# Patient Record
Sex: Female | Born: 1990 | Race: White | Hispanic: No | Marital: Married | Smoking: Current every day smoker
Health system: Southern US, Community
[De-identification: ages and names within clinical notes are randomized; demographics above are authoritative.]

## PROBLEM LIST (undated history)

## (undated) DIAGNOSIS — G43909 Migraine, unspecified, not intractable, without status migrainosus: Secondary | ICD-10-CM

## (undated) DIAGNOSIS — N83209 Unspecified ovarian cyst, unspecified side: Secondary | ICD-10-CM

## (undated) HISTORY — PX: TUBAL LIGATION: SHX77

---
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
## 2008-01-02 ENCOUNTER — Observation Stay: Payer: Self-pay | Admitting: Obstetrics & Gynecology

## 2008-01-08 ENCOUNTER — Observation Stay: Payer: Self-pay | Admitting: Unknown Physician Specialty

## 2008-01-09 ENCOUNTER — Observation Stay: Payer: Self-pay

## 2008-01-09 ENCOUNTER — Observation Stay: Payer: Self-pay | Admitting: Unknown Physician Specialty

## 2008-01-14 ENCOUNTER — Inpatient Hospital Stay: Payer: Self-pay

## 2008-06-26 ENCOUNTER — Emergency Department: Payer: Self-pay | Admitting: Emergency Medicine

## 2008-07-12 ENCOUNTER — Emergency Department: Payer: Self-pay | Admitting: Internal Medicine

## 2008-09-22 IMAGING — CR DG FOOT 2V*L*
1 series · 2 of 2 positions shown · non-contrast
Comparison: none

REASON FOR EXAM: pain left 5th metarsal and toe
COMMENTS:

PROCEDURE:     DXR - DXR FOOT LEFT AP AND LATERAL  - June 06, 2007  [DATE]
RESULT:     Images of the LEFT foot are compared to the exam dated
09/23/2006. There is no evidence of fracture, dislocation or radiopaque
foreign body.

[Series 1: view not recorded · 0.17mm/px · 2 of 2 slices shown]
[im 1/2]
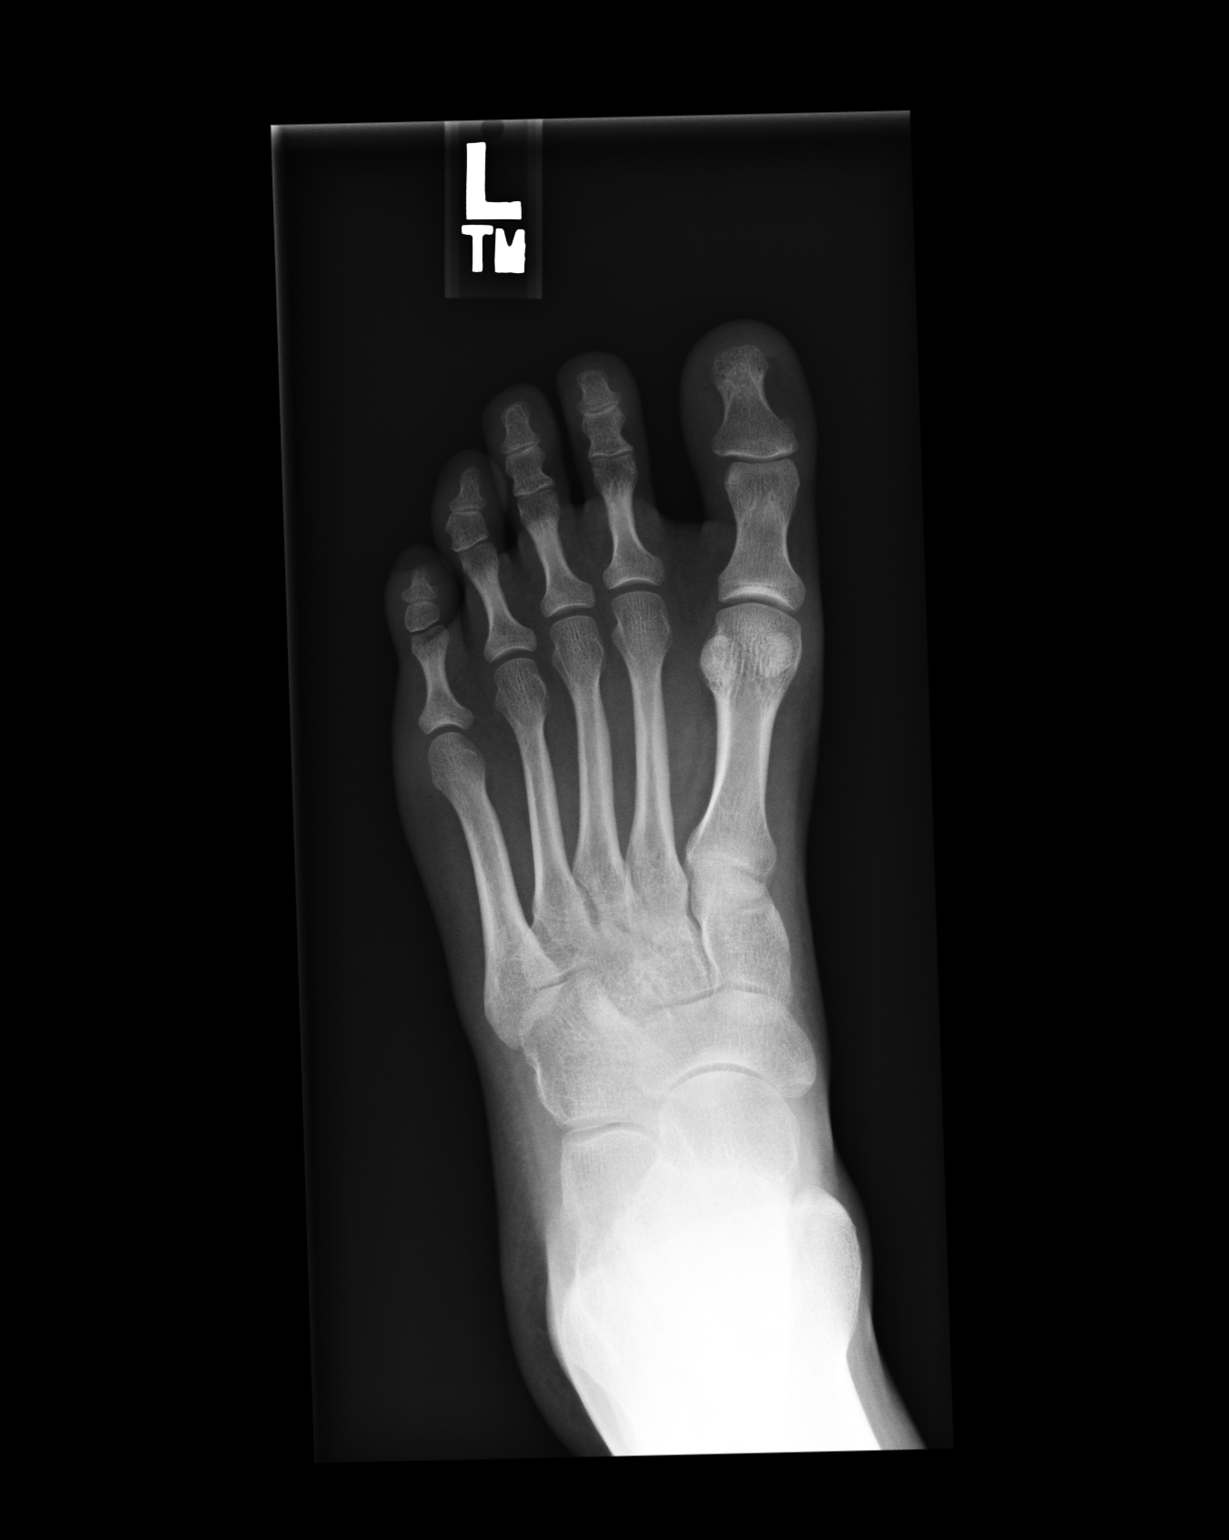
[im 2/2]
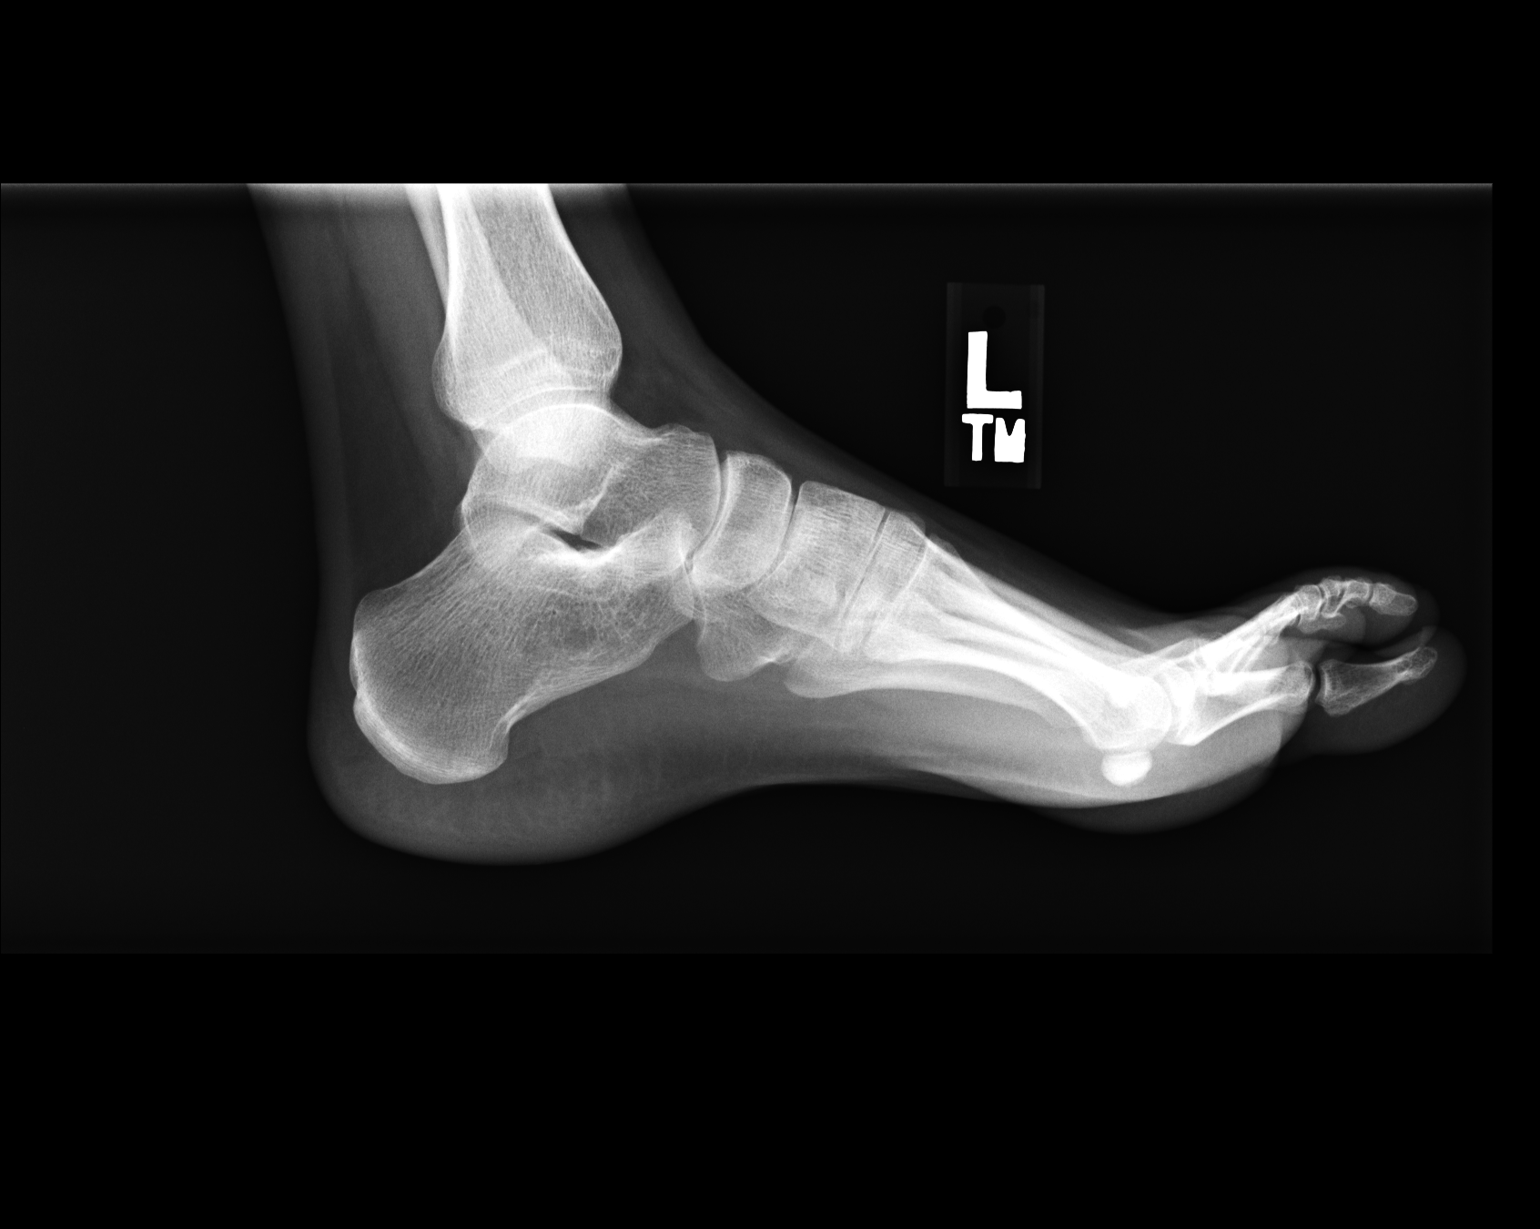

[2 of 2 positions shown; findings below may reference images not displayed]

IMPRESSION: No acute bony abnormality.

## 2008-11-02 ENCOUNTER — Emergency Department: Payer: Self-pay | Admitting: Emergency Medicine

## 2008-11-24 ENCOUNTER — Emergency Department: Payer: Self-pay

## 2009-01-08 ENCOUNTER — Emergency Department: Payer: Self-pay | Admitting: Emergency Medicine

## 2009-02-09 ENCOUNTER — Emergency Department: Payer: Self-pay

## 2009-02-17 ENCOUNTER — Ambulatory Visit: Payer: Self-pay | Admitting: Family Medicine

## 2009-04-25 ENCOUNTER — Emergency Department: Payer: Self-pay | Admitting: Emergency Medicine

## 2009-04-28 ENCOUNTER — Emergency Department: Payer: Self-pay | Admitting: Internal Medicine

## 2009-05-07 ENCOUNTER — Emergency Department: Payer: Self-pay | Admitting: Emergency Medicine

## 2009-08-30 ENCOUNTER — Emergency Department: Payer: Self-pay | Admitting: Emergency Medicine

## 2009-10-25 ENCOUNTER — Emergency Department: Payer: Self-pay | Admitting: Emergency Medicine

## 2009-11-08 ENCOUNTER — Emergency Department: Payer: Self-pay | Admitting: Emergency Medicine

## 2009-12-21 ENCOUNTER — Emergency Department: Payer: Self-pay | Admitting: Emergency Medicine

## 2009-12-31 ENCOUNTER — Inpatient Hospital Stay: Payer: Self-pay | Admitting: Psychiatry

## 2010-03-15 ENCOUNTER — Encounter: Payer: Self-pay | Admitting: Obstetrics & Gynecology

## 2010-04-01 ENCOUNTER — Observation Stay: Payer: Self-pay

## 2010-05-24 ENCOUNTER — Encounter: Payer: Self-pay | Admitting: Obstetrics & Gynecology

## 2010-06-21 ENCOUNTER — Observation Stay: Payer: Self-pay

## 2010-06-22 ENCOUNTER — Observation Stay: Payer: Self-pay

## 2010-07-04 ENCOUNTER — Observation Stay: Payer: Self-pay | Admitting: Unknown Physician Specialty

## 2010-07-15 ENCOUNTER — Observation Stay: Payer: Self-pay | Admitting: Emergency Medicine

## 2010-07-23 ENCOUNTER — Inpatient Hospital Stay: Payer: Self-pay | Admitting: Obstetrics and Gynecology

## 2010-07-27 LAB — PATHOLOGY REPORT
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
## 2011-03-10 ENCOUNTER — Emergency Department: Payer: Self-pay | Admitting: Emergency Medicine

## 2011-04-06 ENCOUNTER — Emergency Department: Payer: Self-pay | Admitting: Emergency Medicine

## 2011-04-07 ENCOUNTER — Emergency Department: Payer: Self-pay | Admitting: Emergency Medicine

## 2012-01-08 ENCOUNTER — Emergency Department: Payer: Self-pay | Admitting: Emergency Medicine

## 2012-01-08 LAB — URINALYSIS, COMPLETE
Bilirubin,UR: NEGATIVE
Glucose,UR: NEGATIVE mg/dL (ref 0–75)
Ketone: NEGATIVE
Nitrite: POSITIVE
Ph: 5 (ref 4.5–8.0)
Protein: 100
RBC,UR: 45 /HPF (ref 0–5)
Specific Gravity: 1.013 (ref 1.003–1.030)
Squamous Epithelial: 3
WBC UR: 1792 /HPF (ref 0–5)

## 2012-01-08 LAB — COMPREHENSIVE METABOLIC PANEL
Albumin: 4.3 g/dL (ref 3.4–5.0)
Alkaline Phosphatase: 67 U/L (ref 50–136)
Anion Gap: 11 (ref 7–16)
BUN: 8 mg/dL (ref 7–18)
Bilirubin,Total: 0.7 mg/dL (ref 0.2–1.0)
Calcium, Total: 9.3 mg/dL (ref 8.5–10.1)
Chloride: 105 mmol/L (ref 98–107)
Co2: 22 mmol/L (ref 21–32)
Creatinine: 1.02 mg/dL (ref 0.60–1.30)
EGFR (African American): >60
EGFR (Non-African Amer.): >60
Glucose: 132 mg/dL — ABNORMAL HIGH (ref 65–99)
Osmolality: 276 (ref 275–301)
Potassium: 3.3 mmol/L — ABNORMAL LOW (ref 3.5–5.1)
SGOT(AST): 40 U/L — ABNORMAL HIGH (ref 15–37)
SGPT (ALT): 46 U/L
Sodium: 138 mmol/L (ref 136–145)
Total Protein: 8.4 g/dL — ABNORMAL HIGH (ref 6.4–8.2)

## 2012-01-08 LAB — PREGNANCY, URINE: Pregnancy Test, Urine: NEGATIVE m[IU]/mL

## 2012-01-08 LAB — CBC
HCT: 40.5 % (ref 35.0–47.0)
HGB: 13.9 g/dL (ref 12.0–16.0)
MCH: 31.2 pg (ref 26.0–34.0)
MCHC: 34.2 g/dL (ref 32.0–36.0)
MCV: 91 fL (ref 80–100)
Platelet: 180 10*3/uL (ref 150–440)
RBC: 4.45 10*6/uL (ref 3.80–5.20)
RDW: 13.9 % (ref 11.5–14.5)
WBC: 16.8 10*3/uL — ABNORMAL HIGH (ref 3.6–11.0)

## 2012-01-10 LAB — URINE CULTURE

## 2012-01-14 LAB — CULTURE, BLOOD (SINGLE)

## 2012-03-17 ENCOUNTER — Emergency Department: Payer: Self-pay | Admitting: Emergency Medicine

## 2012-03-17 LAB — COMPREHENSIVE METABOLIC PANEL
Albumin: 4 g/dL (ref 3.4–5.0)
Alkaline Phosphatase: 94 U/L (ref 50–136)
Anion Gap: 8 (ref 7–16)
BUN: 6 mg/dL — ABNORMAL LOW (ref 7–18)
Bilirubin,Total: 0.6 mg/dL (ref 0.2–1.0)
Calcium, Total: 9 mg/dL (ref 8.5–10.1)
Chloride: 103 mmol/L (ref 98–107)
Co2: 24 mmol/L (ref 21–32)
Creatinine: 0.81 mg/dL (ref 0.60–1.30)
EGFR (African American): >60
EGFR (Non-African Amer.): >60
Glucose: 109 mg/dL — ABNORMAL HIGH (ref 65–99)
Osmolality: 268 (ref 275–301)
Potassium: 3.6 mmol/L (ref 3.5–5.1)
SGOT(AST): 24 U/L (ref 15–37)
SGPT (ALT): 21 U/L
Sodium: 135 mmol/L — ABNORMAL LOW (ref 136–145)
Total Protein: 8.5 g/dL — ABNORMAL HIGH (ref 6.4–8.2)

## 2012-03-17 LAB — PREGNANCY, URINE: Pregnancy Test, Urine: NEGATIVE m[IU]/mL

## 2012-03-17 LAB — CBC
HCT: 35.2 % (ref 35.0–47.0)
HGB: 12.1 g/dL (ref 12.0–16.0)
MCH: 30.4 pg (ref 26.0–34.0)
MCHC: 34.4 g/dL (ref 32.0–36.0)
MCV: 88 fL (ref 80–100)
Platelet: 264 10*3/uL (ref 150–440)
RBC: 3.99 10*6/uL (ref 3.80–5.20)
RDW: 14.3 % (ref 11.5–14.5)
WBC: 21.3 10*3/uL — ABNORMAL HIGH (ref 3.6–11.0)

## 2012-03-17 LAB — URINALYSIS, COMPLETE
Bacteria: NONE SEEN
Bilirubin,UR: NEGATIVE
Glucose,UR: NEGATIVE mg/dL
Ketone: NEGATIVE
Nitrite: NEGATIVE
Ph: 6
Protein: NEGATIVE
RBC,UR: <1 /HPF
Specific Gravity: 1.003
Squamous Epithelial: 6
WBC UR: 1 /HPF

## 2012-03-17 LAB — TROPONIN I: Troponin-I: <0.02 ng/mL

## 2012-03-17 LAB — CK TOTAL AND CKMB (NOT AT ARMC)
CK, Total: 65 U/L (ref 21–215)
CK-MB: <0.5 ng/mL — ABNORMAL LOW (ref 0.5–3.6)

## 2012-05-15 ENCOUNTER — Emergency Department: Payer: Self-pay | Admitting: Emergency Medicine

## 2012-05-15 LAB — CBC
HCT: 39.5 % (ref 35.0–47.0)
HGB: 13.2 g/dL (ref 12.0–16.0)
MCH: 29.6 pg (ref 26.0–34.0)
MCHC: 33.4 g/dL (ref 32.0–36.0)
MCV: 89 fL (ref 80–100)
Platelet: 218 10*3/uL (ref 150–440)
RBC: 4.44 10*6/uL (ref 3.80–5.20)
RDW: 15.1 % — ABNORMAL HIGH (ref 11.5–14.5)
WBC: 13.4 10*3/uL — ABNORMAL HIGH (ref 3.6–11.0)

## 2012-05-15 LAB — PREGNANCY, URINE: Pregnancy Test, Urine: NEGATIVE m[IU]/mL

## 2012-05-15 LAB — BASIC METABOLIC PANEL
Anion Gap: 6 — ABNORMAL LOW (ref 7–16)
BUN: 9 mg/dL (ref 7–18)
Calcium, Total: 9.2 mg/dL (ref 8.5–10.1)
Chloride: 104 mmol/L (ref 98–107)
Co2: 26 mmol/L (ref 21–32)
Creatinine: 0.8 mg/dL (ref 0.60–1.30)
EGFR (African American): >60
EGFR (Non-African Amer.): >60
Glucose: 106 mg/dL — ABNORMAL HIGH (ref 65–99)
Osmolality: 271 (ref 275–301)
Potassium: 3.4 mmol/L — ABNORMAL LOW (ref 3.5–5.1)
Sodium: 136 mmol/L (ref 136–145)

## 2012-10-07 ENCOUNTER — Emergency Department: Payer: Self-pay | Admitting: Emergency Medicine

## 2012-10-07 LAB — COMPREHENSIVE METABOLIC PANEL
Albumin: 4.3 g/dL (ref 3.4–5.0)
Alkaline Phosphatase: 102 U/L (ref 50–136)
Anion Gap: 6 — ABNORMAL LOW (ref 7–16)
BUN: 8 mg/dL (ref 7–18)
Bilirubin,Total: 0.4 mg/dL (ref 0.2–1.0)
Calcium, Total: 9 mg/dL (ref 8.5–10.1)
Chloride: 106 mmol/L (ref 98–107)
Co2: 26 mmol/L (ref 21–32)
Creatinine: 0.71 mg/dL (ref 0.60–1.30)
EGFR (African American): >60
EGFR (Non-African Amer.): >60
Glucose: 101 mg/dL — ABNORMAL HIGH (ref 65–99)
Osmolality: 274 (ref 275–301)
Potassium: 3.2 mmol/L — ABNORMAL LOW (ref 3.5–5.1)
SGOT(AST): 30 U/L (ref 15–37)
SGPT (ALT): 24 U/L (ref 12–78)
Sodium: 138 mmol/L (ref 136–145)
Total Protein: 8 g/dL (ref 6.4–8.2)

## 2012-10-07 LAB — CBC
HCT: 43.3 % (ref 35.0–47.0)
HGB: 14.6 g/dL (ref 12.0–16.0)
MCH: 31 pg (ref 26.0–34.0)
MCHC: 33.7 g/dL (ref 32.0–36.0)
MCV: 92 fL (ref 80–100)
Platelet: 195 10*3/uL (ref 150–440)
RBC: 4.71 10*6/uL (ref 3.80–5.20)
RDW: 14.8 % — ABNORMAL HIGH (ref 11.5–14.5)
WBC: 8.6 10*3/uL (ref 3.6–11.0)

## 2012-10-08 LAB — URINALYSIS, COMPLETE
Bacteria: NONE SEEN
Bilirubin,UR: NEGATIVE
Glucose,UR: NEGATIVE mg/dL (ref 0–75)
Ketone: NEGATIVE
Nitrite: NEGATIVE
Ph: 7 (ref 4.5–8.0)
Protein: NEGATIVE
RBC,UR: 71 /HPF (ref 0–5)
Specific Gravity: 1.017 (ref 1.003–1.030)
Squamous Epithelial: 1
WBC UR: 19 /HPF (ref 0–5)

## 2012-10-08 LAB — WET PREP, GENITAL

## 2013-05-26 ENCOUNTER — Emergency Department: Payer: Self-pay | Admitting: Emergency Medicine

## 2013-05-26 LAB — URINALYSIS, COMPLETE
Bilirubin,UR: NEGATIVE
Blood: NEGATIVE
Glucose,UR: NEGATIVE mg/dL (ref 0–75)
Ketone: NEGATIVE
Nitrite: NEGATIVE
Ph: 7 (ref 4.5–8.0)
Protein: NEGATIVE
RBC,UR: 2 /HPF (ref 0–5)
Specific Gravity: 1.023 (ref 1.003–1.030)
Squamous Epithelial: 6
WBC UR: 38 /HPF (ref 0–5)

## 2013-07-04 IMAGING — CR DG CHEST 2V
1 series · 2 of 2 positions shown · non-contrast
Comparison: none

REASON FOR EXAM: chest pain
COMMENTS:

[Series 1: pa · 0.17mm/px · 2 of 2 slices shown]
[im 1/2]
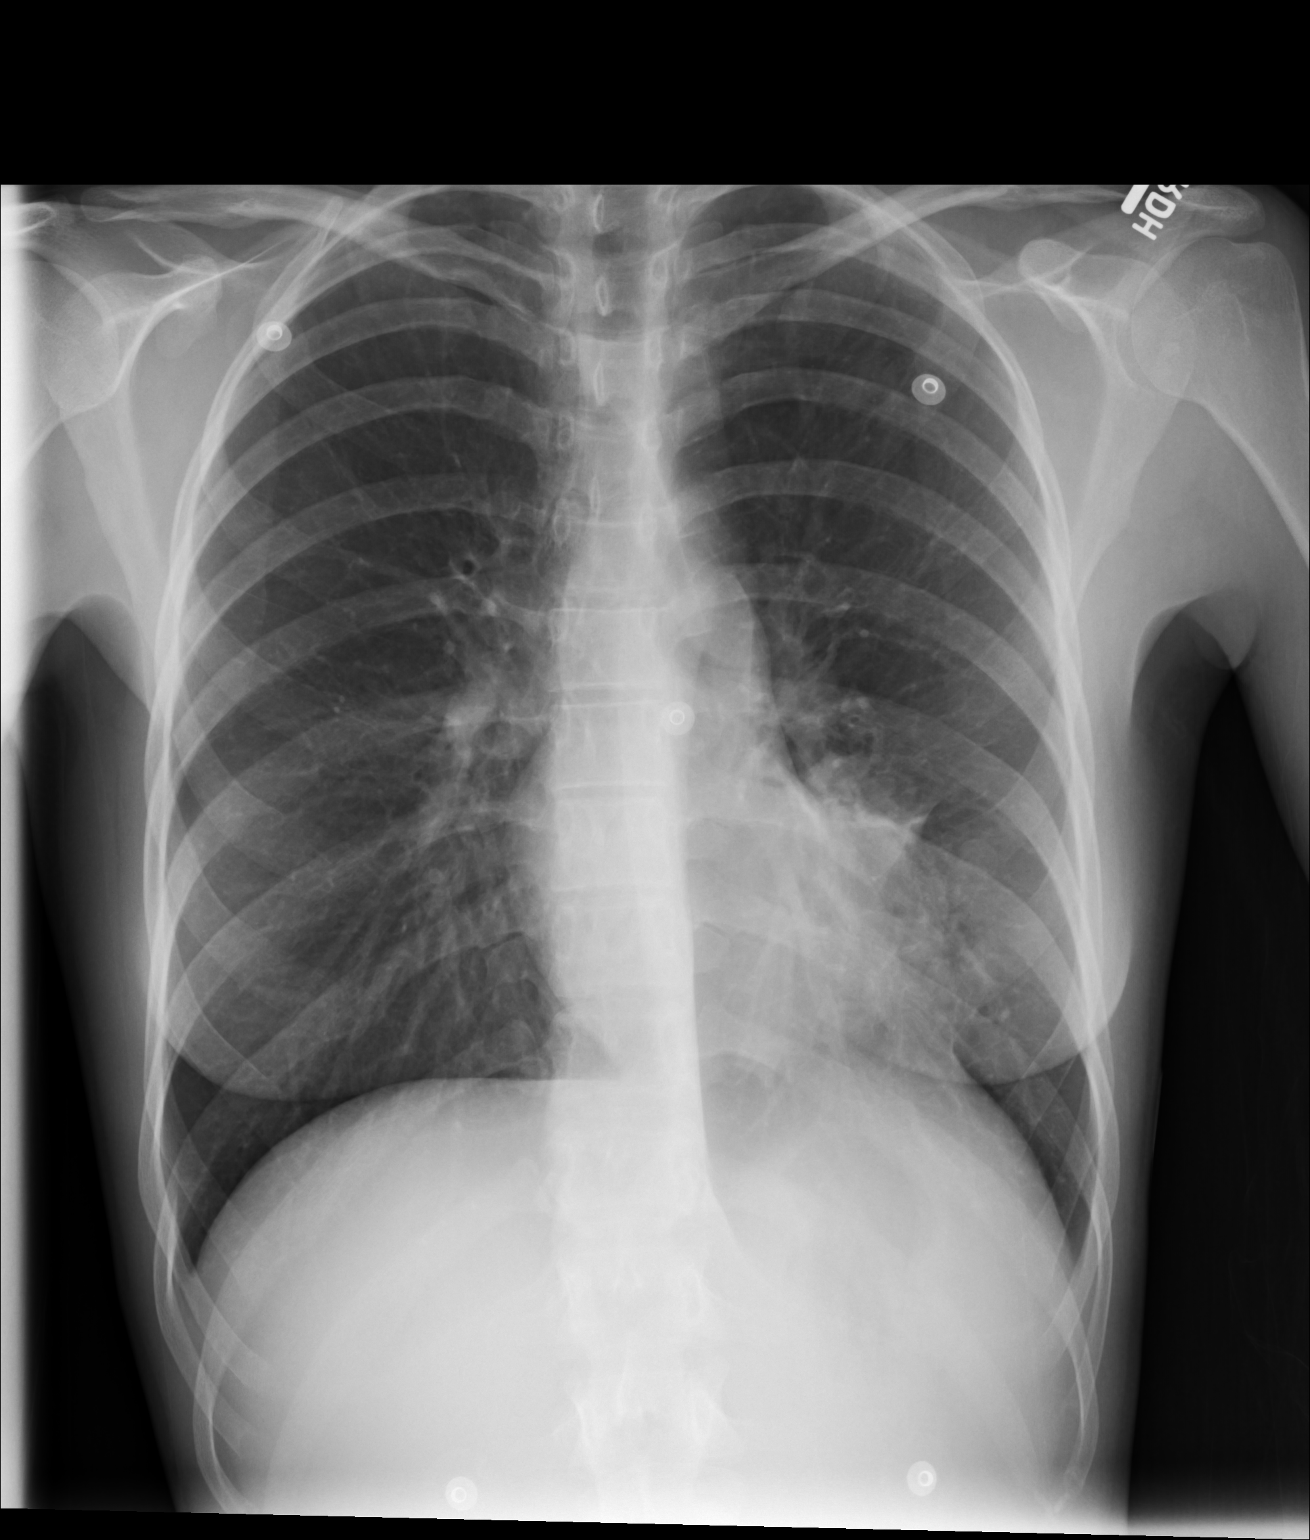
[im 2/2]
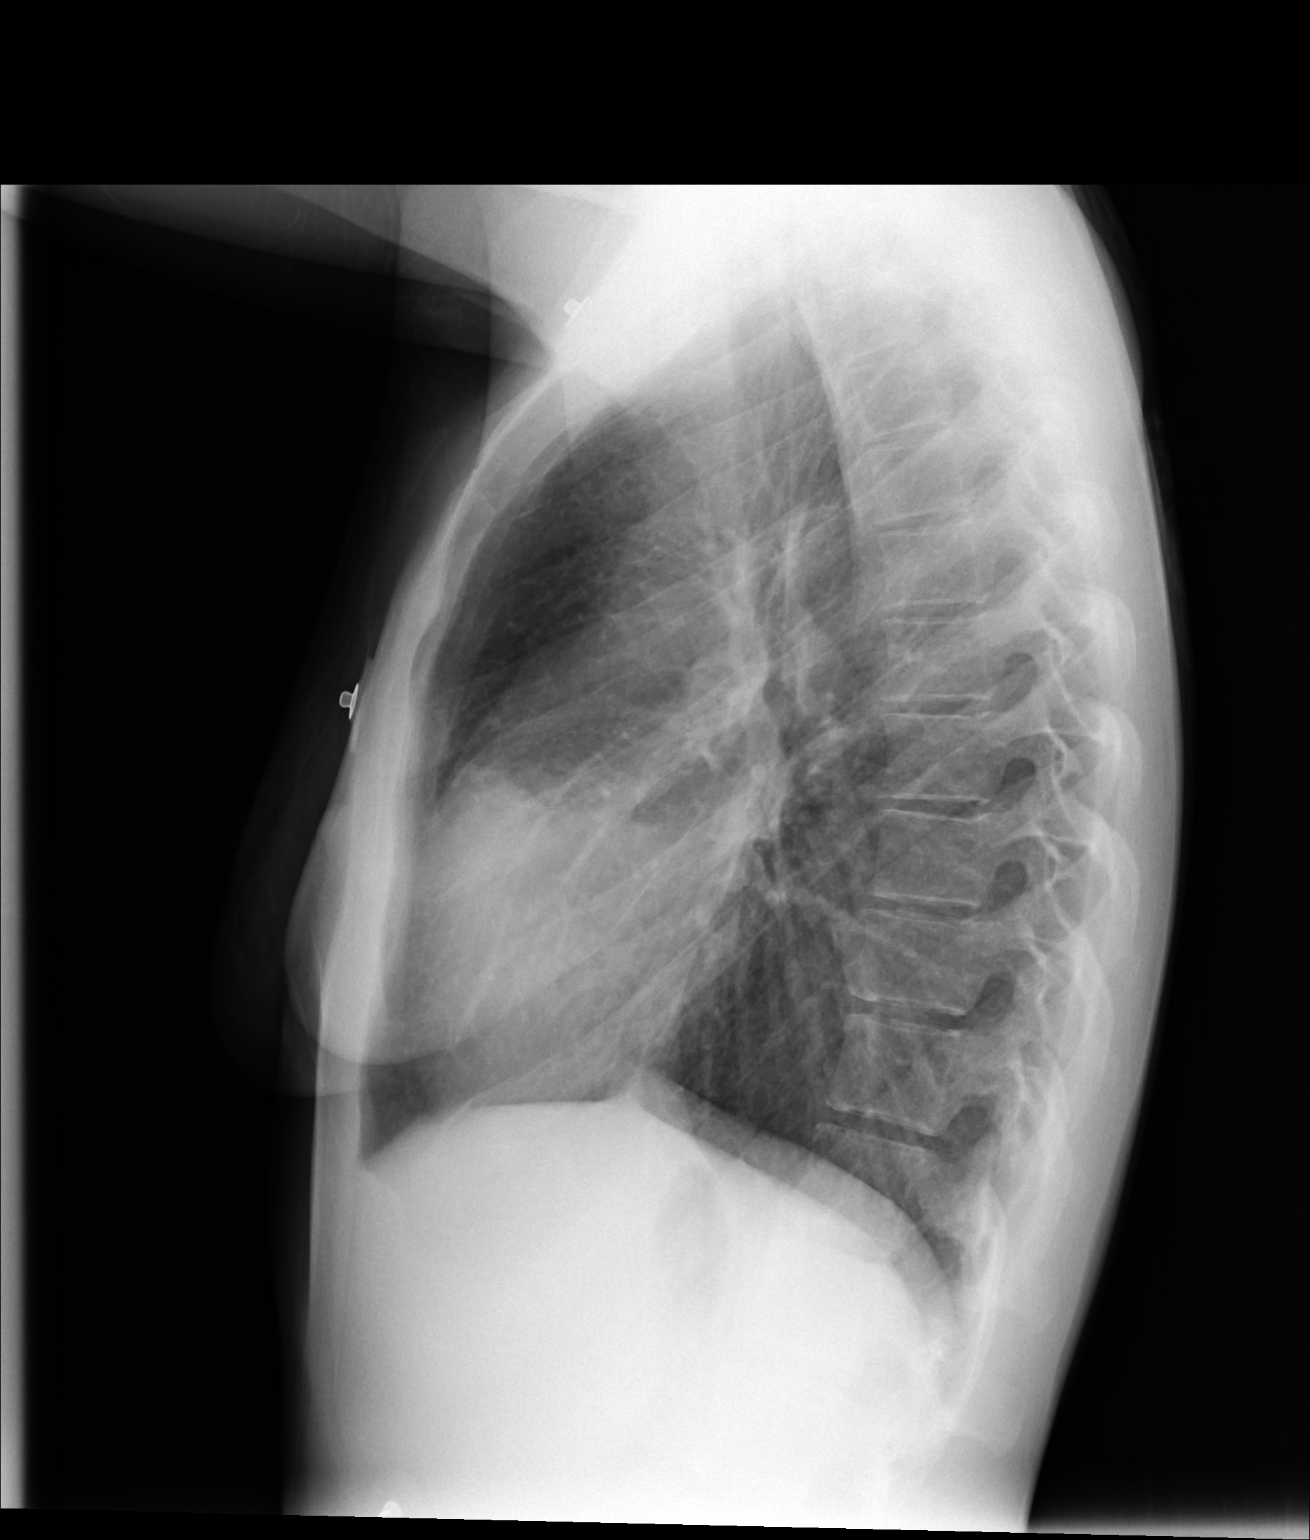

[2 of 2 positions shown; findings below may reference images not displayed]

PROCEDURE:     DXR - DXR CHEST PA (OR AP) AND LATERAL  - March 17, 2012  [DATE]

RESULT:     Comparison is made to previous examination dated [DATE].

Minimal density is seen adjacent to the left heart border projecting
anteriorly on the lateral view consistent with lingular pneumonia. The lungs
are otherwise clear. The cardiac silhouette is otherwise unremarkable. There
is no edema, effusion or pneumothorax. The bony structures appear intact.
IMPRESSION: 1. Findings consistent with lingular pneumonia.

[REDACTED]

## 2014-04-20 ENCOUNTER — Emergency Department: Payer: Self-pay | Admitting: Emergency Medicine

## 2014-04-20 LAB — URINALYSIS, COMPLETE
Bacteria: NONE SEEN
Bilirubin,UR: NEGATIVE
Blood: NEGATIVE
Glucose,UR: NEGATIVE mg/dL (ref 0–75)
Ketone: NEGATIVE
Nitrite: NEGATIVE
Ph: 7 (ref 4.5–8.0)
Protein: NEGATIVE
RBC,UR: 16 /HPF (ref 0–5)
Specific Gravity: 1.009 (ref 1.003–1.030)
Squamous Epithelial: 11
WBC UR: 19 /HPF (ref 0–5)

## 2014-04-20 LAB — PREGNANCY, URINE: Pregnancy Test, Urine: NEGATIVE m[IU]/mL

## 2014-08-04 ENCOUNTER — Emergency Department: Payer: Self-pay | Admitting: Emergency Medicine

## 2014-08-04 LAB — URINALYSIS, COMPLETE
Bacteria: NONE SEEN
Bilirubin,UR: NEGATIVE
Glucose,UR: NEGATIVE mg/dL (ref 0–75)
Ketone: NEGATIVE
Nitrite: NEGATIVE
Ph: 8 (ref 4.5–8.0)
Protein: NEGATIVE
RBC,UR: NONE SEEN /HPF (ref 0–5)
Specific Gravity: 1.004 (ref 1.003–1.030)
Squamous Epithelial: 1
WBC UR: 5 /HPF (ref 0–5)

## 2014-08-04 LAB — LIPASE, BLOOD: Lipase: 123 U/L (ref 73–393)

## 2014-08-04 LAB — COMPREHENSIVE METABOLIC PANEL
Albumin: 4.2 g/dL (ref 3.4–5.0)
Alkaline Phosphatase: 77 U/L
Anion Gap: 8 (ref 7–16)
BUN: 5 mg/dL — ABNORMAL LOW (ref 7–18)
Bilirubin,Total: 0.2 mg/dL (ref 0.2–1.0)
Calcium, Total: 8.9 mg/dL (ref 8.5–10.1)
Chloride: 106 mmol/L (ref 98–107)
Co2: 26 mmol/L (ref 21–32)
Creatinine: 0.82 mg/dL (ref 0.60–1.30)
EGFR (African American): >60
EGFR (Non-African Amer.): >60
Glucose: 85 mg/dL (ref 65–99)
Osmolality: 276 (ref 275–301)
Potassium: 4.1 mmol/L (ref 3.5–5.1)
SGOT(AST): 29 U/L (ref 15–37)
SGPT (ALT): 22 U/L
Sodium: 140 mmol/L (ref 136–145)
Total Protein: 7.4 g/dL (ref 6.4–8.2)

## 2014-08-04 LAB — CBC WITH DIFFERENTIAL/PLATELET
Basophil #: 0 10*3/uL (ref 0.0–0.1)
Basophil %: 0.3 %
Eosinophil #: 0.1 10*3/uL (ref 0.0–0.7)
Eosinophil %: 1.4 %
HCT: 41.4 % (ref 35.0–47.0)
HGB: 13.9 g/dL (ref 12.0–16.0)
Lymphocyte #: 2.6 10*3/uL (ref 1.0–3.6)
Lymphocyte %: 26.9 %
MCH: 31.7 pg (ref 26.0–34.0)
MCHC: 33.5 g/dL (ref 32.0–36.0)
MCV: 95 fL (ref 80–100)
Monocyte #: 0.5 x10 3/mm (ref 0.2–0.9)
Monocyte %: 4.8 %
Neutrophil #: 6.5 10*3/uL (ref 1.4–6.5)
Neutrophil %: 66.6 %
Platelet: 216 10*3/uL (ref 150–440)
RBC: 4.38 10*6/uL (ref 3.80–5.20)
RDW: 13.8 % (ref 11.5–14.5)
WBC: 9.8 10*3/uL (ref 3.6–11.0)

## 2014-08-04 LAB — WET PREP, GENITAL

## 2014-08-04 LAB — GC/CHLAMYDIA PROBE AMP

## 2015-02-01 ENCOUNTER — Emergency Department: Payer: Self-pay | Admitting: Internal Medicine

## 2015-05-17 ENCOUNTER — Emergency Department
Admission: EM | Admit: 2015-05-17 | Discharge: 2015-05-17 | Disposition: A | Discharge status: Home / Self Care | Payer: Self-pay | Attending: Emergency Medicine | Admitting: Emergency Medicine

## 2015-05-17 ENCOUNTER — Encounter: Payer: Self-pay | Admitting: Emergency Medicine

## 2015-05-17 DIAGNOSIS — N3001 Acute cystitis with hematuria: Secondary | ICD-10-CM | POA: Insufficient documentation

## 2015-05-17 DIAGNOSIS — Z3202 Encounter for pregnancy test, result negative: Secondary | ICD-10-CM | POA: Insufficient documentation

## 2015-05-17 DIAGNOSIS — Z720 Tobacco use: Secondary | ICD-10-CM | POA: Insufficient documentation

## 2015-05-17 HISTORY — DX: Unspecified ovarian cyst, unspecified side: N83.209

## 2015-05-17 LAB — URINALYSIS COMPLETE WITH MICROSCOPIC (ARMC ONLY)
Bacteria, UA: NONE SEEN
Bilirubin Urine: NEGATIVE
Glucose, UA: NEGATIVE mg/dL
Hgb urine dipstick: NEGATIVE
Ketones, ur: NEGATIVE mg/dL
Nitrite: NEGATIVE
Protein, ur: NEGATIVE mg/dL
Specific Gravity, Urine: 1.019 (ref 1.005–1.030)
pH: 5 (ref 5.0–8.0)

## 2015-05-17 LAB — PREGNANCY, URINE: Preg Test, Ur: NEGATIVE

## 2015-05-17 MED ORDER — PHENAZOPYRIDINE HCL 200 MG PO TABS: 200.0000 mg | ORAL_TABLET | Freq: Three times a day (TID) | ORAL | Status: DC | PRN

## 2015-05-17 MED ORDER — FLUCONAZOLE 150 MG PO TABS: 150.0000 mg | ORAL_TABLET | Freq: Every day | ORAL | Status: DC

## 2015-05-17 MED ORDER — HYDROCODONE-ACETAMINOPHEN 5-325 MG PO TABS: 1.0000 | ORAL_TABLET | ORAL | Status: DC | PRN

## 2015-05-17 MED ORDER — SULFAMETHOXAZOLE-TRIMETHOPRIM 800-160 MG PO TABS: 1.0000 | ORAL_TABLET | Freq: Two times a day (BID) | ORAL | Status: DC

## 2015-05-17 NOTE — ED Notes (Signed)
AAOx3.  Skin warm and dry.  Ambulates with easy, steady gait. NAD

## 2015-05-17 NOTE — ED Provider Notes (Signed)
[facility] Emergency Department Provider Note  ____________________________________________  Time seen: Approximately 1:16 PM  I have reviewed the triage vital signs and the nursing notes.   HISTORY  Chief Complaint Dysuria    HPI Lisa Booker is a 24 y.o. female presents for evaluation of urinary tract infection symptoms 3 days. Complains of burning upon urination and lower abdominal and left flank pain. Denies any fever, chills no nausea, vomiting.   Past Medical History  Diagnosis Date  . Ovarian cyst     There are no active problems to display for this patient.   History reviewed. No pertinent past surgical history.  Current Outpatient Rx  Name  Route  Sig  Dispense  Refill  . fluconazole (DIFLUCAN) 150 MG tablet   Oral   Take 1 tablet (150 mg total) by mouth daily.   1 tablet   0   . HYDROcodone-acetaminophen (NORCO) 5-325 MG per tablet   Oral   Take 1-2 tablets by mouth every 4 (four) hours as needed for moderate pain.   15 tablet   0   . phenazopyridine (PYRIDIUM) 200 MG tablet   Oral   Take 1 tablet (200 mg total) by mouth 3 (three) times daily as needed for pain.   6 tablet   0   . sulfamethoxazole-trimethoprim (BACTRIM DS,SEPTRA DS) 800-160 MG per tablet   Oral   Take 1 tablet by mouth 2 (two) times daily.   20 tablet   0     Allergies Zofran  History reviewed. No pertinent family history.  Social History History  Substance Use Topics  . Smoking status: Heavy Tobacco Smoker -- 1.00 packs/day  . Smokeless tobacco: Not on file  . Alcohol Use: No    Review of Systems Constitutional: No fever/chills Eyes: No visual changes. ENT: No sore throat. Cardiovascular: Denies chest pain. Respiratory: Denies shortness of breath. Gastrointestinal: No abdominal pain.  No nausea, no vomiting.  No diarrhea.  No constipation. Genitourinary: Positive for dysuria. Musculoskeletal: Negative for back pain. Skin: Negative  for rash. Neurological: Negative for headaches, focal weakness or numbness.  10-point ROS otherwise negative.  ____________________________________________   PHYSICAL EXAM:  VITAL SIGNS: ED Triage Vitals  Enc Vitals Group     BP 05/17/15 1223 105/56 mmHg     Pulse Rate 05/17/15 1223 85     Resp 05/17/15 1223 16     Temp 05/17/15 1223 98.6 F (37 C)     Temp Source 05/17/15 1223 Oral     SpO2 05/17/15 1223 99 %     Weight 05/17/15 1223 110 lb (49.896 kg)     Height 05/17/15 1223  (1.6 m)     Head Cir --      Peak Flow --      Pain Score 05/17/15 1224 6     Pain Loc --      Pain Edu? --      Excl. in GC? --     Constitutional: Alert and oriented. Well appearing and in no acute distress. Eyes: Conjunctivae are normal. PERRL. EOMI. Head: Atraumatic. Nose: No congestion/rhinnorhea. Mouth/Throat: Mucous membranes are moist.  Oropharynx non-erythematous. Neck: No stridor.   Cardiovascular: Normal rate, regular rhythm. Grossly normal heart sounds.  Good peripheral circulation. Respiratory: Normal respiratory effort.  No retractions. Lungs CTAB. Gastrointestinal: Soft and nontender. No distention. No abdominal bruits. Positive CVA tenderness left greater than right. Genitourinary: Exam deferred Musculoskeletal: No lower extremity tenderness nor edema.  No joint effusions.  Neurologic:  Normal speech and language. No gross focal neurologic deficits are appreciated. Speech is normal. No gait instability. Skin:  Skin is warm, dry and intact. No rash noted. Psychiatric: Mood and affect are normal. Speech and behavior are normal.  ____________________________________________   LABS (all labs ordered are listed, but only abnormal results are displayed)  Labs Reviewed  URINALYSIS COMPLETEWITH MICROSCOPIC ([facility] ONLY) - Abnormal; Notable for the following:    Color, Urine YELLOW (*)    APPearance HAZY (*)    Leukocytes, UA 2+ (*)    Squamous Epithelial / LPF 0-5 (*)    All  other components within normal limits  PREGNANCY, URINE   ____________________________________________  PROCEDURES  Procedure(s) performed: None  Critical Care performed: No  ____________________________________________   INITIAL IMPRESSION / ASSESSMENT AND PLAN / ED COURSE  Pertinent labs & imaging results that were available during my care of the patient were reviewed by me and considered in my medical decision making (see chart for details).  Acute urinary tract infection. Rx given for Bactrim DS twice a day #20, protein 200 mg 3 times a day #6, Diflucan 150 mg by mouth 1 upon completion of the course. Patient voices no other emergency medical complaints at this time and will return to the ER with any worsening symptomology. ____________________________________________   FINAL CLINICAL IMPRESSION(S) / ED DIAGNOSES  Final diagnoses:  Acute cystitis with hematuria      Evangeline Dakinharles M Compton Brigance, PA-C 05/17/15 1323  Minna AntisKevin Paduchowski, MD 05/17/15 1453

## 2015-05-17 NOTE — Discharge Instructions (Signed)

## 2015-05-17 NOTE — ED Notes (Signed)
Started with symptoms on friday

## 2015-07-19 ENCOUNTER — Encounter: Payer: Self-pay | Admitting: Emergency Medicine

## 2015-07-19 ENCOUNTER — Emergency Department
Admission: EM | Admit: 2015-07-19 | Discharge: 2015-07-19 | Disposition: A | Discharge status: Home / Self Care | Payer: Self-pay | Attending: Emergency Medicine | Admitting: Emergency Medicine

## 2015-07-19 DIAGNOSIS — Z79899 Other long term (current) drug therapy: Secondary | ICD-10-CM | POA: Insufficient documentation

## 2015-07-19 DIAGNOSIS — B9689 Other specified bacterial agents as the cause of diseases classified elsewhere: Secondary | ICD-10-CM

## 2015-07-19 DIAGNOSIS — K645 Perianal venous thrombosis: Secondary | ICD-10-CM | POA: Insufficient documentation

## 2015-07-19 DIAGNOSIS — Z720 Tobacco use: Secondary | ICD-10-CM | POA: Insufficient documentation

## 2015-07-19 DIAGNOSIS — Z3202 Encounter for pregnancy test, result negative: Secondary | ICD-10-CM | POA: Insufficient documentation

## 2015-07-19 DIAGNOSIS — N760 Acute vaginitis: Secondary | ICD-10-CM | POA: Insufficient documentation

## 2015-07-19 DIAGNOSIS — R319 Hematuria, unspecified: Secondary | ICD-10-CM | POA: Insufficient documentation

## 2015-07-19 DIAGNOSIS — N390 Urinary tract infection, site not specified: Secondary | ICD-10-CM | POA: Insufficient documentation

## 2015-07-19 LAB — URINALYSIS COMPLETE WITH MICROSCOPIC (ARMC ONLY)
Bacteria, UA: NONE SEEN
Bilirubin Urine: NEGATIVE
Glucose, UA: NEGATIVE mg/dL
Hgb urine dipstick: NEGATIVE
Ketones, ur: NEGATIVE mg/dL
Nitrite: NEGATIVE
Protein, ur: NEGATIVE mg/dL
Specific Gravity, Urine: 1.02 (ref 1.005–1.030)
pH: 6 (ref 5.0–8.0)

## 2015-07-19 LAB — COMPREHENSIVE METABOLIC PANEL
ALBUMIN: 4.7 g/dL (ref 3.5–5.0)
ALT: 14 U/L (ref 14–54)
ANION GAP: 7 (ref 5–15)
AST: 22 U/L (ref 15–41)
Alkaline Phosphatase: 60 U/L (ref 38–126)
BUN: 9 mg/dL (ref 6–20)
CO2: 29 mmol/L (ref 22–32)
Calcium: 9.7 mg/dL (ref 8.9–10.3)
Chloride: 102 mmol/L (ref 101–111)
Creatinine, Ser: 0.95 mg/dL (ref 0.44–1.00)
GFR calc Af Amer: >60 mL/min (ref >60)
GFR calc non Af Amer: >60 mL/min (ref >60)
GLUCOSE: 73 mg/dL (ref 65–99)
Potassium: 3.5 mmol/L (ref 3.5–5.1)
SODIUM: 138 mmol/L (ref 135–145)
Total Bilirubin: 0.9 mg/dL (ref 0.3–1.2)
Total Protein: 8 g/dL (ref 6.5–8.1)

## 2015-07-19 LAB — WET PREP, GENITAL
Trich, Wet Prep: NONE SEEN
Yeast Wet Prep HPF POC: NONE SEEN

## 2015-07-19 LAB — CHLAMYDIA/NGC RT PCR (ARMC ONLY)
CHLAMYDIA TR: NOT DETECTED
N GONORRHOEAE: NOT DETECTED

## 2015-07-19 LAB — CBC
HEMATOCRIT: 42 % (ref 35.0–47.0)
HEMOGLOBIN: 14.4 g/dL (ref 12.0–16.0)
MCH: 31.5 pg (ref 26.0–34.0)
MCHC: 34.3 g/dL (ref 32.0–36.0)
MCV: 92 fL (ref 80.0–100.0)
Platelets: 166 10*3/uL (ref 150–440)
RBC: 4.57 MIL/uL (ref 3.80–5.20)
RDW: 13.9 % (ref 11.5–14.5)
WBC: 9.5 10*3/uL (ref 3.6–11.0)

## 2015-07-19 LAB — LIPASE, BLOOD: Lipase: 36 U/L (ref 22–51)

## 2015-07-19 LAB — POCT PREGNANCY, URINE: Preg Test, Ur: NEGATIVE

## 2015-07-19 MED ORDER — METRONIDAZOLE 500 MG PO TABS
500.0000 mg | ORAL_TABLET | Freq: Once | ORAL | Status: AC
  Administered 2015-07-19: 500 mg via ORAL
  Filled 2015-07-19: qty 1

## 2015-07-19 MED ORDER — HYDROCORTISONE ACETATE 25 MG RE SUPP: 25.0000 mg | Freq: Two times a day (BID) | RECTAL | Status: AC

## 2015-07-19 MED ORDER — HYDROCODONE-ACETAMINOPHEN 5-325 MG PO TABS: 1.0000 | ORAL_TABLET | Freq: Four times a day (QID) | ORAL | Status: DC | PRN

## 2015-07-19 MED ORDER — DIBUCAINE 1 % EX OINT: TOPICAL_OINTMENT | Freq: Three times a day (TID) | CUTANEOUS | Status: AC | PRN

## 2015-07-19 MED ORDER — HYDROCORTISONE ACETATE 25 MG RE SUPP
25.0000 mg | Freq: Once | RECTAL | Status: AC
  Administered 2015-07-19: 25 mg via RECTAL
  Filled 2015-07-19: qty 1

## 2015-07-19 MED ORDER — METRONIDAZOLE 500 MG PO TABS: 500.0000 mg | ORAL_TABLET | Freq: Two times a day (BID) | ORAL | Status: DC

## 2015-07-19 MED ORDER — HYDROCODONE-ACETAMINOPHEN 5-325 MG PO TABS
1.0000 | ORAL_TABLET | Freq: Once | ORAL | Status: AC
  Administered 2015-07-19: 1 via ORAL

## 2015-07-19 MED ORDER — DIBUCAINE 1 % RE OINT: TOPICAL_OINTMENT | RECTAL | Status: DC | PRN

## 2015-07-19 MED ORDER — SULFAMETHOXAZOLE-TRIMETHOPRIM 800-160 MG PO TABS: 1.0000 | ORAL_TABLET | Freq: Two times a day (BID) | ORAL | Status: DC

## 2015-07-19 MED ORDER — SULFAMETHOXAZOLE-TRIMETHOPRIM 800-160 MG PO TABS
1.0000 | ORAL_TABLET | Freq: Once | ORAL | Status: AC
  Administered 2015-07-19: 1 via ORAL
  Filled 2015-07-19: qty 1

## 2015-07-19 MED ORDER — HYDROCODONE-ACETAMINOPHEN 5-325 MG PO TABS
ORAL_TABLET | ORAL | Status: AC
  Administered 2015-07-19: 1 via ORAL
  Filled 2015-07-19: qty 1

## 2015-07-19 MED ORDER — PROMETHAZINE HCL 25 MG PO TABS
25.0000 mg | ORAL_TABLET | Freq: Once | ORAL | Status: AC
Start: 2015-07-19 — End: 2015-07-19
  Administered 2015-07-19: 25 mg via ORAL
  Filled 2015-07-19: qty 1

## 2015-07-19 MED ORDER — MORPHINE SULFATE (PF) 4 MG/ML IV SOLN
4.0000 mg | Freq: Once | INTRAVENOUS | Status: AC
  Administered 2015-07-19: 4 mg via INTRAMUSCULAR
  Filled 2015-07-19: qty 1

## 2015-07-19 NOTE — Discharge Instructions (Signed)
You were evaluated for rectal pain and found to have a hemorrhoid, and are being started on symptomatic medications for this.  You were also evaluated for vaginal discharge and found have bacterial vaginosis, for which she were being started on antibiotic Flagyl.  You were also evaluated for symptoms of a urinary tract infection, and you're being treated for urine tract infection with antibiotic Bactrim.  Return to the emergency Department if you have any new or worsening symptoms including worsening pain, abdominal pain, black or bloody stools, vomiting, fever, or any other symptoms concerning to you.   Bacterial Vaginosis Bacterial vaginosis is a vaginal infection that occurs when the normal balance of bacteria in the vagina is disrupted. It results from an overgrowth of certain bacteria. This is the most common vaginal infection in women of childbearing age. Treatment is important to prevent complications, especially in pregnant women, as it can cause a premature delivery. CAUSES  Bacterial vaginosis is caused by an increase in harmful bacteria that are normally present in smaller amounts in the vagina. Several different kinds of bacteria can cause bacterial vaginosis. However, the reason that the condition develops is not fully understood. RISK FACTORS Certain activities or behaviors can put you at an increased risk of developing bacterial vaginosis, including:  Having a new sex partner or multiple sex partners.  Douching.  Using an intrauterine device (IUD) for contraception. Women do not get bacterial vaginosis from toilet seats, bedding, swimming pools, or contact with objects around them. SIGNS AND SYMPTOMS  Some women with bacterial vaginosis have no signs or symptoms. Common symptoms include:  Grey vaginal discharge.  A fishlike odor with discharge, especially after sexual intercourse.  Itching or burning of the vagina and vulva.  Burning or pain with urination. DIAGNOSIS    Your health care provider will take a medical history and examine the vagina for signs of bacterial vaginosis. A sample of vaginal fluid may be taken. Your health care provider will look at this sample under a microscope to check for bacteria and abnormal cells. A vaginal pH test may also be done.  TREATMENT  Bacterial vaginosis may be treated with antibiotic medicines. These may be given in the form of a pill or a vaginal cream. A second round of antibiotics may be prescribed if the condition comes back after treatment.  HOME CARE INSTRUCTIONS   Only take over-the-counter or prescription medicines as directed by your health care provider.  If antibiotic medicine was prescribed, take it as directed. Make sure you finish it even if you start to feel better.  Do not have sex until treatment is completed.  Tell all sexual partners that you have a vaginal infection. They should see their health care provider and be treated if they have problems, such as a mild rash or itching.  Practice safe sex by using condoms and only having one sex partner. SEEK MEDICAL CARE IF:   Your symptoms are not improving after 3 days of treatment.  You have increased discharge or pain.  You have a fever. MAKE SURE YOU:   Understand these instructions.  Will watch your condition.  Will get help right away if you are not doing well or get worse. FOR MORE INFORMATION  Centers for Disease Control and Prevention, Division of STD Prevention: SolutionApps.co.za American Sexual Health Association (ASHA): www.ashastd.org  Document Released: 10/24/2005 Document Revised: 08/14/2013 Document Reviewed: 06/05/2013 [facility] Patient Information 2015 [place], [place]. This information is not intended to replace advice given to you by your  health care provider. Make sure you discuss any questions you have with your health care provider.  Hemorrhoids Hemorrhoids are puffy (swollen) veins around the rectum or anus. Hemorrhoids  can cause pain, itching, bleeding, or irritation. HOME CARE  Eat foods with fiber, such as whole grains, beans, nuts, fruits, and vegetables. Ask your doctor about taking products with added fiber in them (fibersupplements).  Drink enough fluid to keep your pee (urine) clear or pale yellow.  Exercise often.  Go to the bathroom when you have the urge to poop. Do not wait.  Avoid straining to poop (bowel movement).  Keep the butt area dry and clean. Use wet toilet paper or moist paper towels.  Medicated creams and medicine inserted into the anus (anal suppository) may be used or applied as told.  Only take medicine as told by your doctor.  Take a warm water bath (sitz bath) for 15-20 minutes to ease pain. Do this 3-4 times a day.  Place ice packs on the area if it is tender or puffy. Use the ice packs between the warm water baths.  Put ice in a plastic bag.  Place a towel between your skin and the bag.  Leave the ice on for 15-20 minutes, 03-04 times a day.  Do not use a donut-shaped pillow or sit on the toilet for a long time. GET HELP RIGHT AWAY IF:   You have more pain that is not controlled by treatment or medicine.  You have bleeding that will not stop.  You have trouble or are unable to poop (bowel movement).  You have pain or puffiness outside the area of the hemorrhoids. MAKE SURE YOU:   Understand these instructions.  Will watch your condition.  Will get help right away if you are not doing well or get worse. Document Released: 08/02/2008 Document Revised: 10/10/2012 Document Reviewed: 09/04/2012 [facility] Patient Information 2015 [place], [place]. This information is not intended to replace advice given to you by your health care provider. Make sure you discuss any questions you have with your health care provider.  Urinary Tract Infection Urinary tract infections (UTIs) can develop anywhere along your urinary tract. Your urinary tract is your body's drainage  system for removing wastes and extra water. Your urinary tract includes two kidneys, two ureters, a bladder, and a urethra. Your kidneys are a pair of bean-shaped organs. Each kidney is about the size of your fist. They are located below your ribs, one on each side of your spine. CAUSES Infections are caused by microbes, which are microscopic organisms, including fungi, viruses, and bacteria. These organisms are so small that they can only be seen through a microscope. Bacteria are the microbes that most commonly cause UTIs. SYMPTOMS  Symptoms of UTIs may vary by age and gender of the patient and by the location of the infection. Symptoms in young women typically include a frequent and intense urge to urinate and a painful, burning feeling in the bladder or urethra during urination. Older women and men are more likely to be tired, shaky, and weak and have muscle aches and abdominal pain. A fever may mean the infection is in your kidneys. Other symptoms of a kidney infection include pain in your back or sides below the ribs, nausea, and vomiting. DIAGNOSIS To diagnose a UTI, your caregiver will ask you about your symptoms. Your caregiver also will ask to provide a urine sample. The urine sample will be tested for bacteria and white blood cells. White blood cells are  made by your body to help fight infection. TREATMENT  Typically, UTIs can be treated with medication. Because most UTIs are caused by a bacterial infection, they usually can be treated with the use of antibiotics. The choice of antibiotic and length of treatment depend on your symptoms and the type of bacteria causing your infection. HOME CARE INSTRUCTIONS  If you were prescribed antibiotics, take them exactly as your caregiver instructs you. Finish the medication even if you feel better after you have only taken some of the medication.  Drink enough water and fluids to keep your urine clear or pale yellow.  Avoid caffeine, tea, and  carbonated beverages. They tend to irritate your bladder.  Empty your bladder often. Avoid holding urine for long periods of time.  Empty your bladder before and after sexual intercourse.  After a bowel movement, women should cleanse from front to back. Use each tissue only once. SEEK MEDICAL CARE IF:   You have back pain.  You develop a fever.  Your symptoms do not begin to resolve within 3 days. SEEK IMMEDIATE MEDICAL CARE IF:   You have severe back pain or lower abdominal pain.  You develop chills.  You have nausea or vomiting.  You have continued burning or discomfort with urination. MAKE SURE YOU:   Understand these instructions.  Will watch your condition.  Will get help right away if you are not doing well or get worse. Document Released: 08/03/2005 Document Revised: 04/24/2012 Document Reviewed: 12/02/2011 [facility] Patient Information 2015 [place], [place]. This information is not intended to replace advice given to you by your health care provider. Make sure you discuss any questions you have with your health care provider.

## 2015-07-19 NOTE — ED Provider Notes (Signed)
[facility] Emergency Department Provider Note   ____________________________________________  Time seen: 8:30 PM I have reviewed the triage vital signs and the triage nursing note.  HISTORY  Chief Complaint Abdominal Pain; Hemorrhoids; and Vaginal Discharge   Historian Patient  HPI Lisa Booker is a 24 y.o. female who is here for complaint of hemorrhoid pain. She's been having hemorrhoid pain for about 1 week and she's never had hemorrhoids before. She's been trying over-the-counter hemorrhoid creams. She's also the last 1-2 days had some lower pelvic cramping which felt like a prior UTI. No specific dysuria or back pain. No fever. She is also having small amount of vaginal discharge. Symptoms are moderate. States she nearly passed out due to pain from the hemorrhoid today at work.    Past Medical History  Diagnosis Date  . Ovarian cyst     There are no active problems to display for this patient.   Past Surgical History  Procedure Laterality Date  . Tubal ligation      Current Outpatient Rx  Name  Route  Sig  Dispense  Refill  . dibucaine (NUPERCAINAL) 1 % ointment   Topical   Apply topically 3 (three) times daily as needed for pain.   30 g   0   . fluconazole (DIFLUCAN) 150 MG tablet   Oral   Take 1 tablet (150 mg total) by mouth daily.   1 tablet   0   . HYDROcodone-acetaminophen (NORCO/VICODIN) 5-325 MG per tablet   Oral   Take 1 tablet by mouth every 6 (six) hours as needed for moderate pain.   10 tablet   0   . hydrocortisone (ANUSOL-HC) 25 MG suppository   Rectal   Place 1 suppository (25 mg total) rectally every 12 (twelve) hours.   12 suppository   0   . metroNIDAZOLE (FLAGYL) 500 MG tablet   Oral   Take 1 tablet (500 mg total) by mouth 2 (two) times daily.   20 tablet   0   . phenazopyridine (PYRIDIUM) 200 MG tablet   Oral   Take 1 tablet (200 mg total) by mouth 3 (three) times daily as needed for pain.   6  tablet   0   . sulfamethoxazole-trimethoprim (BACTRIM DS,SEPTRA DS) 800-160 MG per tablet   Oral   Take 1 tablet by mouth 2 (two) times daily.   20 tablet   0     Allergies Zofran  No family history on file.  Social History Social History  Substance Use Topics  . Smoking status: Heavy Tobacco Smoker -- 1.00 packs/day  . Smokeless tobacco: None  . Alcohol Use: No    Review of Systems  Constitutional: Negative for fever. Eyes: Negative for visual changes. ENT: Negative for sore throat. Cardiovascular: Negative for chest pain. Respiratory: Negative for shortness of breath. Gastrointestinal: Negative for vomiting and diarrhea. Genitourinary: Negative for dysuria. Musculoskeletal: Negative for back pain. Skin: Negative for rash. Neurological: Negative for headache. 10 point Review of Systems otherwise negative ____________________________________________   PHYSICAL EXAM:  VITAL SIGNS: ED Triage Vitals  Enc Vitals Group     BP 07/19/15 1650 111/61 mmHg     Pulse Rate 07/19/15 1650 89     Resp 07/19/15 1650 18     Temp 07/19/15 1650 98.7 F (37.1 C)     Temp Source 07/19/15 1650 Oral     SpO2 07/19/15 1650 96 %     Weight 07/19/15 1650 120 lb (54.432 kg)  Height 07/19/15 1650 5\' 3"  (1.6 m)     Head Cir --      Peak Flow --      Pain Score 07/19/15 1659 7     Pain Loc --      Pain Edu? --      Excl. in GC? --      Constitutional: Alert and oriented. Well appearing and in no distress. Eyes: Conjunctivae are normal. PERRL. Normal extraocular movements. ENT   Head: Normocephalic and atraumatic.   Nose: No congestion/rhinnorhea.   Mouth/Throat: Mucous membranes are moist.   Neck: No stridor. Cardiovascular/Chest: Normal rate, regular rhythm.  No murmurs, rubs, or gallops. Respiratory: Normal respiratory effort without tachypnea nor retractions. Breath sounds are clear and equal bilaterally. No wheezes/rales/rhonchi. Gastrointestinal: Soft. No  distention, no guarding, no rebound. Minimal tenderness superpubic. No McBurney's point tenderness.  Genitourinary/rectal: Moderate- sized hemorrhoid at 9:00 position, thrombosed. Vaginal exam positive for moderate vaginal discharge without cervicitis or adnexal mass or tenderness. Musculoskeletal: Nontender with normal range of motion in all extremities. No joint effusions.  No lower extremity tenderness.  No edema. Neurologic:  Normal speech and language. No gross or focal neurologic deficits are appreciated. Skin:  Skin is warm, dry and intact. No rash noted. Psychiatric: Mood and affect are normal. Speech and behavior are normal. Patient exhibits appropriate insight and judgment.  ____________________________________________   EKG I, Governor Rooks, MD, the attending physician have personally viewed and interpreted all ECGs.  No EKG performed ____________________________________________  LABS (pertinent positives/negatives)  Urine pregnancy test negative Urinalysis positive for 3+ leukocytes, 6-30 red blood cells, 6-30 white blood cells and no bacteria. Negative for nitrites Lipase 36 Comprehensive metabolic panel within normal limits CBC within normal limits Wet prep positive for clue cells and too numerous to count white blood cells. ____________________________________________  RADIOLOGY All Xrays were viewed by me. Imaging interpreted by Radiologist.  None __________________________________________  PROCEDURES  Procedure(s) performed: None  Critical Care performed: None  ____________________________________________   ED COURSE / ASSESSMENT AND PLAN  CONSULTATIONS: None  Pertinent labs & imaging results that were available during my care of the patient were reviewed by me and considered in my medical decision making (see chart for details).  Patient was given symptomatically pain medication for pain due to hemorrhoid.  Patient having symptoms consistent with  urinary tract infection and urinalysis is equivocal, but concerning clinical symptoms, therefore I am going to treat with Bactrim.   ----------------------------------------- 10:53 PM on 07/19/2015 -----------------------------------------  Patient's pain is under control. For hemorrhoid she is going to be sent home with Anusol and topical pain medication as well as a few doses of pain medication.  Wet prep was positive for bacterial vaginosis, I discussed this with the patient and I'm also adding the antibiotic Flagyl.   Patient / Family / Caregiver informed of clinical course, medical decision-making process, and agree with plan.   I discussed return precautions, follow-up instructions, and discharged instructions with patient and/or family.  ___________________________________________   FINAL CLINICAL IMPRESSION(S) / ED DIAGNOSES   Final diagnoses:  External hemorrhoid, thrombosed  Urinary tract infection with hematuria, site unspecified  Bacterial vaginosis       Governor Rooks, MD 07/19/15 2254

## 2015-07-19 NOTE — ED Notes (Addendum)
Pt reports painful Hemorid for past 3-4 days. States she has used hemorrhoid medication over the counter with no relief. Pt also reports lower abd pain that started yesterday. Pt concerned for UTI. Pt reports creamy vaginal discharge. Denies fever. Pt also reports feeling dizzy.

## 2015-07-21 LAB — URINE CULTURE

## 2016-01-08 ENCOUNTER — Emergency Department
Admission: EM | Admit: 2016-01-08 | Discharge: 2016-01-08 | Disposition: A | Discharge status: Home / Self Care | Payer: Self-pay | Attending: Emergency Medicine | Admitting: Emergency Medicine

## 2016-01-08 ENCOUNTER — Emergency Department: Payer: Self-pay

## 2016-01-08 ENCOUNTER — Encounter: Payer: Self-pay | Admitting: Emergency Medicine

## 2016-01-08 DIAGNOSIS — Z792 Long term (current) use of antibiotics: Secondary | ICD-10-CM | POA: Insufficient documentation

## 2016-01-08 DIAGNOSIS — F17200 Nicotine dependence, unspecified, uncomplicated: Secondary | ICD-10-CM | POA: Insufficient documentation

## 2016-01-08 DIAGNOSIS — Z79899 Other long term (current) drug therapy: Secondary | ICD-10-CM | POA: Insufficient documentation

## 2016-01-08 DIAGNOSIS — B070 Plantar wart: Secondary | ICD-10-CM | POA: Insufficient documentation

## 2016-01-08 MED ORDER — NAPROXEN 500 MG PO TABS: 500.0000 mg | ORAL_TABLET | Freq: Two times a day (BID) | ORAL | Status: DC

## 2016-01-08 NOTE — ED Notes (Signed)
C/O left heel pain x 2-3 weeks.  Describes pain as similar to callous.

## 2016-01-08 NOTE — ED Notes (Signed)
Patient going home with family.

## 2016-01-08 NOTE — ED Provider Notes (Signed)
[facility] Emergency Department Provider Note  ____________________________________________  Time seen: Approximately 7:38 PM  I have reviewed the triage vital signs and the nursing notes.   HISTORY  Chief Complaint Foot Pain    HPI Lisa Booker is a 25 y.o. female who presents to the emergency department complaining of left heel pain 2-3 weeks. Patient denies any injury precipitating this. Patient states that it is very painful to walk on. Patient states that there is minimal pain at rest with moderate pain with ambulation. Patient denies any erythema or edema to area. She denies any numbness or tingling in her distal foot. No other complaints at this time.   Past Medical History  Diagnosis Date  . Ovarian cyst     There are no active problems to display for this patient.   Past Surgical History  Procedure Laterality Date  . Tubal ligation      Current Outpatient Rx  Name  Route  Sig  Dispense  Refill  . dibucaine (NUPERCAINAL) 1 % ointment   Topical   Apply topically 3 (three) times daily as needed for pain.   30 g   0   . fluconazole (DIFLUCAN) 150 MG tablet   Oral   Take 1 tablet (150 mg total) by mouth daily.   1 tablet   0   . HYDROcodone-acetaminophen (NORCO/VICODIN) 5-325 MG per tablet   Oral   Take 1 tablet by mouth every 6 (six) hours as needed for moderate pain.   10 tablet   0   . hydrocortisone (ANUSOL-HC) 25 MG suppository   Rectal   Place 1 suppository (25 mg total) rectally every 12 (twelve) hours.   12 suppository   0   . metroNIDAZOLE (FLAGYL) 500 MG tablet   Oral   Take 1 tablet (500 mg total) by mouth 2 (two) times daily.   20 tablet   0   . naproxen (NAPROSYN) 500 MG tablet   Oral   Take 1 tablet (500 mg total) by mouth 2 (two) times daily with a meal.   60 tablet   0   . phenazopyridine (PYRIDIUM) 200 MG tablet   Oral   Take 1 tablet (200 mg total) by mouth 3 (three) times daily as needed for  pain.   6 tablet   0   . sulfamethoxazole-trimethoprim (BACTRIM DS,SEPTRA DS) 800-160 MG per tablet   Oral   Take 1 tablet by mouth 2 (two) times daily.   20 tablet   0     Allergies Zofran  No family history on file.  Social History Social History  Substance Use Topics  . Smoking status: Heavy Tobacco Smoker -- 1.00 packs/day  . Smokeless tobacco: None  . Alcohol Use: No     Review of Systems  Constitutional: No fever/chills Musculoskeletal: Negative for back pain. Positive for left heel pain. Skin: Negative for rash. Neurological: Negative for headaches, focal weakness or numbness. 10-point ROS otherwise negative.  ____________________________________________   PHYSICAL EXAM:  VITAL SIGNS: ED Triage Vitals  Enc Vitals Group     BP 01/08/16 1843 122/45 mmHg     Pulse Rate 01/08/16 1843 65     Resp 01/08/16 1843 16     Temp 01/08/16 1843 98.2 F (36.8 C)     Temp Source 01/08/16 1843 Oral     SpO2 01/08/16 1843 100 %     Weight 01/08/16 1843 110 lb (49.896 kg)     Height 01/08/16 1843 5'  3" (1.6 m)     Head Cir --      Peak Flow --      Pain Score 01/08/16 1843 7     Pain Loc --      Pain Edu? --      Excl. in GC? --      Constitutional: Alert and oriented. Well appearing and in no acute distress. Eyes: Conjunctivae are normal. PERRL. EOMI. Head: Atraumatic. Cardiovascular: Normal rate, regular rhythm. Normal S1 and S2.  Good peripheral circulation. Respiratory: Normal respiratory effort without tachypnea or retractions. Lungs CTAB. Musculoskeletal: Full range of motion to ankle and all digits of left foot. Dorsalis pedis pulse appreciated. Sensation intact 5 days. Area is tender to palpation over the calcaneus. Small lesion/nodule is noted in the center of the left calcaneus. Area measures less than 0.5 cm across. Area is not fluctuant to palpation. Area is tender to palpation. Area is firm palpation. Neurologic:  Normal speech and language. No gross  focal neurologic deficits are appreciated.  Skin:  Skin is warm, dry and intact. No rash noted. Psychiatric: Mood and affect are normal. Speech and behavior are normal. Patient exhibits appropriate insight and judgement.   ____________________________________________   LABS (all labs ordered are listed, but only abnormal results are displayed)  Labs Reviewed - No data to display ____________________________________________  EKG   ____________________________________________  RADIOLOGY Festus BarrenI, Saphia Vanderford D Gaudencio Chesnut, personally viewed and evaluated these images (plain radiographs) as part of my medical decision making, as well as reviewing the written report by the radiologist.  Dg Foot Complete Left  01/08/2016  CLINICAL DATA:  C/O left heel pain x 2-3 weeks. Describes pain as similar to callous. No known injury, no hx of same. EXAM: LEFT FOOT - COMPLETE 3+ VIEW COMPARISON:  None. FINDINGS: There is no evidence of fracture or dislocation. There is no evidence of arthropathy or other focal bone abnormality. Soft tissues are unremarkable. IMPRESSION: Negative. Electronically Signed   By: Amie Portlandavid  Ormond M.D.   On: 01/08/2016 19:29    ____________________________________________    PROCEDURES  Procedure(s) performed:       Medications - No data to display   ____________________________________________   INITIAL IMPRESSION / ASSESSMENT AND PLAN / ED COURSE  Pertinent labs & imaging results that were available during my care of the patient were reviewed by me and considered in my medical decision making (see chart for details).  Patient's diagnosis is consistent with a plantar wart to the left calcaneus.. Patient will be discharged home with prescriptions for anti-inflammatories for symptom control. Patient is to follow up with podiatrist if symptoms persist past this treatment course. Patient is given ED precautions to return to the ED for any worsening or new  symptoms.     ____________________________________________  FINAL CLINICAL IMPRESSION(S) / ED DIAGNOSES  Final diagnoses:  Plantar wart of left foot      NEW MEDICATIONS STARTED DURING THIS VISIT:  New Prescriptions   NAPROXEN (NAPROSYN) 500 MG TABLET    Take 1 tablet (500 mg total) by mouth 2 (two) times daily with a meal.        Racheal PatchesJonathan D Windy Dudek, PA-C 01/08/16 1946  Phineas SemenGraydon Goodman, MD 01/08/16 2000

## 2016-01-08 NOTE — Discharge Instructions (Signed)
Plantar Warts Warts are small growths on the skin. They can occur on various areas of the body. When they occur on the underside (sole) of the foot, they are called plantar warts. Plantar warts often occur in groups, with several small warts around a larger growth. They tend to develop over areas of pressure, such as the heel or the ball of the foot. Most warts are not painful, and they usually do not cause problems. However, plantar warts may cause pain when you walk because pressure is applied to them. Warts often go away on their own in time. Various treatments may be done if needed. Sometimes, warts go away and then they come back again. CAUSES Plantar warts are caused by a type of virus that is called human papillomavirus (HPV). HPV attacks a break in the skin of the foot. Walking barefoot can lead to exposure to the virus. These warts may spread to other areas of the sole. They spread to other areas of the body only through direct contact. RISK FACTORS Plantar warts are more likely to develop in:  People who are 10-20 years of age.  People who use public showers or locker rooms.  People who have a weakened body defense system (immune system). SYMPTOMS Plantar warts may be flat or slightly raised. They may grow into the deeper layers of skin or rise above the surface of the skin. Most plantar warts have a rough surface. They may cause pain when you use your foot to support your body weight. DIAGNOSIS A plantar wart can usually be diagnosed from its appearance. In some cases, a tissue sample may be removed (biopsy) to be looked at under a microscope. TREATMENT In many cases, warts do not need treatment. Without treatment, they often go away over a period of many months to a couple years. If treatment is needed, options may include:  Applying medicated solutions, creams, or patches to the wart. These may be over-the-counter or prescription medicines that make the skin soft so that layers will  gradually shed away. In many cases, the medicine is applied one or two times per day and covered with a bandage.  Putting duct tape over the top of the wart (occlusion). You will leave the tape in place for as long as told by your health care provider, then you will replace it with a new strip of tape. This is done until the wart goes away.  Freezing the wart with liquid nitrogen (cryotherapy).  Burning the wart with:  Laser treatment.  An electrified probe (electrocautery).  Injection of a medicine (Candida antigen) into the wart to help the body's immune system to fight off the wart.  Surgery to remove the wart. HOME CARE INSTRUCTIONS  Apply medicated creams or solutions only as told by your health care provider. This may involve:  Soaking the affected area in warm water.  Removing the top layer of softened skin before you apply the medicine. A pumice stone works well for removing the tissue.  Applying a bandage over the affected area after you apply the medicine.  Repeating the process daily or as told by your health care provider.  Do not scratch or pick at a wart.  Wash your hands after you touch a wart.  If a wart is painful, try applying a bandage with a hole in the middle over the wart. The helps to take pressure off the wart.  Keep all follow-up visits as told by your health care provider. This is important. PREVENTION   Take these actions to help prevent warts:  Wear shoes and socks. Change your socks daily.  Keep your feet clean and dry.  Check your feet regularly.  Avoid direct contact with warts on other people. SEEK MEDICAL CARE IF:  Your warts do not improve after treatment.  You have redness, swelling, or pain at the site of a wart.  You have bleeding from a wart that does not stop with light pressure.  You have diabetes and you develop a wart.   This information is not intended to replace advice given to you by your health care provider. Make sure  you discuss any questions you have with your health care provider.   Document Released: 01/14/2004 Document Revised: 07/15/2015 Document Reviewed: 01/19/2015 Elsevier Interactive Patient Education 2016 [employer]  

## 2016-02-20 ENCOUNTER — Emergency Department
Admission: EM | Admit: 2016-02-20 | Discharge: 2016-02-20 | Disposition: A | Discharge status: Home / Self Care | Payer: Self-pay | Attending: Emergency Medicine | Admitting: Emergency Medicine

## 2016-02-20 ENCOUNTER — Encounter: Payer: Self-pay | Admitting: Emergency Medicine

## 2016-02-20 ENCOUNTER — Emergency Department: Payer: Self-pay

## 2016-02-20 DIAGNOSIS — Z79899 Other long term (current) drug therapy: Secondary | ICD-10-CM | POA: Insufficient documentation

## 2016-02-20 DIAGNOSIS — Z792 Long term (current) use of antibiotics: Secondary | ICD-10-CM | POA: Insufficient documentation

## 2016-02-20 DIAGNOSIS — Z791 Long term (current) use of non-steroidal anti-inflammatories (NSAID): Secondary | ICD-10-CM | POA: Insufficient documentation

## 2016-02-20 DIAGNOSIS — M7521 Bicipital tendinitis, right shoulder: Secondary | ICD-10-CM | POA: Insufficient documentation

## 2016-02-20 DIAGNOSIS — F17210 Nicotine dependence, cigarettes, uncomplicated: Secondary | ICD-10-CM | POA: Insufficient documentation

## 2016-02-20 MED ORDER — TRAMADOL HCL 50 MG PO TABS: 50.0000 mg | ORAL_TABLET | Freq: Four times a day (QID) | ORAL | Status: DC | PRN

## 2016-02-20 MED ORDER — NAPROXEN 500 MG PO TABS: 500.0000 mg | ORAL_TABLET | Freq: Two times a day (BID) | ORAL | Status: DC

## 2016-02-20 MED ORDER — TRAMADOL HCL 50 MG PO TABS
50.0000 mg | ORAL_TABLET | Freq: Once | ORAL | Status: AC
  Administered 2016-02-20: 50 mg via ORAL
  Filled 2016-02-20: qty 1

## 2016-02-20 MED ORDER — NAPROXEN 500 MG PO TABS
500.0000 mg | ORAL_TABLET | Freq: Once | ORAL | Status: AC
  Administered 2016-02-20: 500 mg via ORAL
  Filled 2016-02-20: qty 1

## 2016-02-20 NOTE — ED Notes (Signed)
Discussed discharge instructions, prescriptions, and follow-up care with patient. No questions or concerns at this time. Pt stable at discharge. Leaving with spouse who is driving.

## 2016-02-20 NOTE — ED Provider Notes (Signed)
[facility] Emergency Department Provider Note  ____________________________________________  Time seen: Approximately 9:26 AM  I have reviewed the triage vital signs and the nursing notes.   HISTORY  Chief Complaint Shoulder Pain    HPI Lisa Booker is a 25 y.o. female patient complain right shoulder pain which increased with adduction and overhead movements for 4 days. 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 Patient described a pain as "achy progress to sharp with movement". No palliative measures taken for this complaint.   Past Medical History  Diagnosis Date  . Ovarian cyst     There are no active problems to display for this patient.   Past Surgical History  Procedure Laterality Date  . Tubal ligation      Current Outpatient Rx  Name  Route  Sig  Dispense  Refill  . dibucaine (NUPERCAINAL) 1 % ointment   Topical   Apply topically 3 (three) times daily as needed for pain.   30 g   0   . fluconazole (DIFLUCAN) 150 MG tablet   Oral   Take 1 tablet (150 mg total) by mouth daily.   1 tablet   0   . HYDROcodone-acetaminophen (NORCO/VICODIN) 5-325 MG per tablet   Oral   Take 1 tablet by mouth every 6 (six) hours as needed for moderate pain.   10 tablet   0   . hydrocortisone (ANUSOL-HC) 25 MG suppository   Rectal   Place 1 suppository (25 mg total) rectally every 12 (twelve) hours.   12 suppository   0   . metroNIDAZOLE (FLAGYL) 500 MG tablet   Oral   Take 1 tablet (500 mg total) by mouth 2 (two) times daily.   20 tablet   0   . naproxen (NAPROSYN) 500 MG tablet   Oral   Take 1 tablet (500 mg total) by mouth 2 (two) times daily with a meal.   60 tablet   0   . phenazopyridine (PYRIDIUM) 200 MG tablet   Oral   Take 1 tablet (200 mg total) by mouth 3 (three) times daily as needed for pain.   6 tablet    0   . sulfamethoxazole-trimethoprim (BACTRIM DS,SEPTRA DS) 800-160 MG per tablet   Oral   Take 1 tablet by mouth 2 (two) times daily.   20 tablet   0     Allergies Zofran  No family history on file.  Social History Social History  Substance Use Topics  . Smoking status: Heavy Tobacco Smoker -- 0.50 packs/day    Types: Cigarettes  . Smokeless tobacco: None  . Alcohol Use: Yes    Review of Systems Constitutional: No fever/chills Eyes: No visual changes. ENT: No sore throat. Cardiovascular: Denies chest pain. Respiratory: Denies shortness of breath. Gastrointestinal: No abdominal pain.  No nausea, no vomiting.  No diarrhea.  No constipation. Musculoskeletal: Right shoulder pain. Skin: Negative for rash. Neurological: Negative for headaches, focal weakness or numbness. {**Psychiatric: Endocrine: Hematological/Lymphatic: Allergic/Immunilogical: Zofran 10-point ROS otherwise negative.  ____________________________________________   PHYSICAL EXAM:  VITAL SIGNS: ED Triage Vitals  Enc Vitals Group     BP 02/20/16 0918 102/68 mmHg     Pulse Rate 02/20/16 0918 61     Resp 02/20/16 0918 18     Temp 02/20/16 0918 98.2 F (36.8 C)     Temp Source 02/20/16 0918 Oral  SpO2 02/20/16 0918 99 %     Weight 02/20/16 0918 100 lb (45.36 kg)     Height 02/20/16 0918  (1.6 m)     Head Cir --      Peak Flow --      Pain Score 02/20/16 0920 6     Pain Loc --      Pain Edu? --      Excl. in GC? --     Constitutional: Alert and oriented. Well appearing and in no acute distress. Eyes: Conjunctivae are normal. PERRL. EOMI. Head: Atraumatic. Nose: No congestion/rhinnorhea. Mouth/Throat: Mucous membranes are moist.  Oropharynx non-erythematous. Neck: No stridor. No cervical spine tenderness to palpation. Hematological/Lymphatic/Immunilogical: No cervical lymphadenopathy. Cardiovascular: Normal rate, regular rhythm. Grossly normal heart sounds.  Good peripheral  circulation. Respiratory: Normal respiratory effort.  No retractions. Lungs CTAB. Gastrointestinal: Soft and nontender. No distention. No abdominal bruits. No CVA tenderness. Musculoskeletal: No obvious deformity. No edema or erythema. Patient has some moderate guarding palpation of the mid chest with the humerus. Patient is holding the arm in adduction and refuse overhead reaching secondary to complain of pain. Patient has moderate guarding palpation superior aspect of the right bicep muscle area. Neurologic:  Normal speech and language. No gross focal neurologic deficits are appreciated. No gait instability. Skin:  Skin is warm, dry and intact. No rash noted. Psychiatric: Mood and affect are normal. Speech and behavior are normal.  ____________________________________________   LABS (all labs ordered are listed, but only abnormal results are displayed)  Labs Reviewed - No data to display ____________________________________________  EKG   ____________________________________________  RADIOLOGY  No acute findings on x-ray. ____________________________________________   PROCEDURES  Procedure(s) performed: None  Critical Care performed: No  ____________________________________________   INITIAL IMPRESSION / ASSESSMENT AND PLAN / ED COURSE  Pertinent labs & imaging results that were available during my care of the patient were reviewed by me and considered in my medical decision making (see chart for details).  Biceps tendinitis. Patient given discharge care instructions. Patient placed in a sling for comfort. Patient prescription for naproxen and tramadol. Patient advised to follow orthopedics.no improvement in one week. ____________________________________________   FINAL CLINICAL IMPRESSION(S) / ED DIAGNOSES  Final diagnoses:  Biceps tendinitis, right      Joni Reining, PA-C 02/20/16 1010  Joni Reining, PA-C 02/20/16 1013  Jeanmarie Plant, MD 02/20/16  [phone]

## 2016-02-20 NOTE — ED Notes (Signed)
R shoulder pain, severe with movement, x 4 days. 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

## 2016-05-08 ENCOUNTER — Encounter: Payer: Self-pay | Admitting: Emergency Medicine

## 2016-05-08 ENCOUNTER — Emergency Department
Admission: EM | Admit: 2016-05-08 | Discharge: 2016-05-08 | Disposition: A | Discharge status: Home / Self Care | Payer: Self-pay | Attending: Emergency Medicine | Admitting: Emergency Medicine

## 2016-05-08 DIAGNOSIS — Z791 Long term (current) use of non-steroidal anti-inflammatories (NSAID): Secondary | ICD-10-CM | POA: Insufficient documentation

## 2016-05-08 DIAGNOSIS — Z7952 Long term (current) use of systemic steroids: Secondary | ICD-10-CM | POA: Insufficient documentation

## 2016-05-08 DIAGNOSIS — Z792 Long term (current) use of antibiotics: Secondary | ICD-10-CM | POA: Insufficient documentation

## 2016-05-08 DIAGNOSIS — G43909 Migraine, unspecified, not intractable, without status migrainosus: Secondary | ICD-10-CM | POA: Insufficient documentation

## 2016-05-08 DIAGNOSIS — Z79899 Other long term (current) drug therapy: Secondary | ICD-10-CM | POA: Insufficient documentation

## 2016-05-08 DIAGNOSIS — Z87891 Personal history of nicotine dependence: Secondary | ICD-10-CM | POA: Insufficient documentation

## 2016-05-08 MED ORDER — SODIUM CHLORIDE 0.9 % IV SOLN
1000.0000 mL | Freq: Once | INTRAVENOUS | Status: AC
  Administered 2016-05-08: 1000 mL via INTRAVENOUS

## 2016-05-08 MED ORDER — DEXTROSE 5 % IV SOLN
20.0000 mg | Freq: Once | INTRAVENOUS | Status: AC
  Administered 2016-05-08: 20 mg via INTRAVENOUS
  Filled 2016-05-08: qty 4

## 2016-05-08 MED ORDER — DIPHENHYDRAMINE HCL 50 MG/ML IJ SOLN
25.0000 mg | Freq: Once | INTRAMUSCULAR | Status: AC
  Administered 2016-05-08: 25 mg via INTRAVENOUS
  Filled 2016-05-08: qty 1

## 2016-05-08 MED ORDER — KETOROLAC TROMETHAMINE 30 MG/ML IJ SOLN
INTRAMUSCULAR | Status: AC
  Filled 2016-05-08: qty 1

## 2016-05-08 MED ORDER — BUTALBITAL-APAP-CAFFEINE 50-325-40 MG PO TABS: 1.0000 | ORAL_TABLET | Freq: Four times a day (QID) | ORAL | Status: AC | PRN

## 2016-05-08 MED ORDER — KETOROLAC TROMETHAMINE 30 MG/ML IJ SOLN
30.0000 mg | Freq: Once | INTRAMUSCULAR | Status: AC
  Administered 2016-05-08: 30 mg via INTRAVENOUS

## 2016-05-08 NOTE — ED Provider Notes (Signed)
[facility] Emergency Department Provider Note  ____________________________________________    I have reviewed the triage vital signs and the nursing notes.   HISTORY  Chief Complaint Headache    HPI Lisa Booker is a 25 y.o. female who presents with complaints of migraine headache. Patient reports a history of migraines in the past, typically she is able to lock herself in an dark and quiet room and "sleep it off" however this headache is not improving. She reports it started 2 days ago. She she reports photophobia and phonophobia, she complains of a diffuse global throbbing pain that is moderate to severe. She denies neuro deficits. No fevers or chills. No neck pain.     Past Medical History  Diagnosis Date  . Ovarian cyst     There are no active problems to display for this patient.   Past Surgical History  Procedure Laterality Date  . Tubal ligation      Current Outpatient Rx  Name  Route  Sig  Dispense  Refill  . dibucaine (NUPERCAINAL) 1 % ointment   Topical   Apply topically 3 (three) times daily as needed for pain.   30 g   0   . fluconazole (DIFLUCAN) 150 MG tablet   Oral   Take 1 tablet (150 mg total) by mouth daily.   1 tablet   0   . HYDROcodone-acetaminophen (NORCO/VICODIN) 5-325 MG per tablet   Oral   Take 1 tablet by mouth every 6 (six) hours as needed for moderate pain.   10 tablet   0   . hydrocortisone (ANUSOL-HC) 25 MG suppository   Rectal   Place 1 suppository (25 mg total) rectally every 12 (twelve) hours.   12 suppository   0   . metroNIDAZOLE (FLAGYL) 500 MG tablet   Oral   Take 1 tablet (500 mg total) by mouth 2 (two) times daily.   20 tablet   0   . naproxen (NAPROSYN) 500 MG tablet   Oral   Take 1 tablet (500 mg total) by mouth 2 (two) times daily with a meal.   60 tablet   0   . naproxen (NAPROSYN) 500 MG tablet   Oral   Take 1 tablet (500 mg total) by mouth 2 (two) times daily with a  meal.   20 tablet   0   . phenazopyridine (PYRIDIUM) 200 MG tablet   Oral   Take 1 tablet (200 mg total) by mouth 3 (three) times daily as needed for pain.   6 tablet   0   . sulfamethoxazole-trimethoprim (BACTRIM DS,SEPTRA DS) 800-160 MG per tablet   Oral   Take 1 tablet by mouth 2 (two) times daily.   20 tablet   0   . traMADol (ULTRAM) 50 MG tablet   Oral   Take 1 tablet (50 mg total) by mouth every 6 (six) hours as needed for moderate pain.   12 tablet   0     Allergies Zofran  No family history on file.  Social History Social History  Substance Use Topics  . Smoking status: Former Smoker -- 0.50 packs/day    Types: Cigarettes  . Smokeless tobacco: None  . Alcohol Use: Yes    Review of Systems  Constitutional: Negative for fever. Eyes: Negative for redness ENT: Negative for Neck pain Cardiovascular: Negative for chest pain Respiratory: Negative for shortness of breath. Gastrointestinal: Positive for nausea Genitourinary: Reports she is not pregnant, recent period Musculoskeletal: Negative  for Neck pain Skin: Negative for rash. Neurological: Negative for focal weakness, Headache as above Psychiatric: no anxiety    ____________________________________________   PHYSICAL EXAM:  VITAL SIGNS: ED Triage Vitals  Enc Vitals Group     BP 05/08/16 1606 115/83 mmHg     Pulse Rate 05/08/16 1606 97     Resp 05/08/16 1606 18     Temp 05/08/16 1606 97.6 F (36.4 C)     Temp Source 05/08/16 1606 Oral     SpO2 05/08/16 1606 98 %     Weight 05/08/16 1606 110 lb (49.896 kg)     Height 05/08/16 1606 5\' 3"  (1.6 m)     Head Cir --      Peak Flow --      Pain Score 05/08/16 1607 8     Pain Loc --      Pain Edu? --      Excl. in GC? --      Constitutional: Alert and oriented. Well appearing and in no distress.  Eyes: Conjunctivae are normal. No erythema or injection, PERRLA, EOMI ENT   Head: Normocephalic and atraumatic.   Mouth/Throat: Mucous  membranes are moist. Cardiovascular: Normal rate, regular rhythm.  Respiratory: Normal respiratory effort without tachypnea nor retractions.  Gastrointestinal: Soft and non-tender in all quadrants. No distention.  Genitourinary: deferred Musculoskeletal: Nontender with normal range of motion in all extremities. No lower extremity tenderness nor edema. Neurologic:  Normal speech and language. No gross focal neurologic deficits are appreciated.Cranial nerves II-12 normal Skin:  Skin is warm, dry and intact. No rash noted. Psychiatric: Mood and affect are normal. Patient exhibits appropriate insight and judgment.  ____________________________________________    LABS (pertinent positives/negatives)  Labs Reviewed - No data to display  ____________________________________________   EKG  None  ____________________________________________    RADIOLOGY  None  ____________________________________________   PROCEDURES  Procedure(s) performed: none  Critical Care performed: none  ____________________________________________   INITIAL IMPRESSION / ASSESSMENT AND PLAN / ED COURSE  Pertinent labs & imaging results that were available during my care of the patient were reviewed by me and considered in my medical decision making (see chart for details).  Patient's presentation seems consistent with her usual migraines although slightly more intense and longer lasting. We will treat her with IV Reglan, Benadryl, Toradol, IV fluids and reevaluate do not feel imaging is necessary at this time.   ----------------------------------------- 6:30 PM on 05/08/2016 -----------------------------------------  Patient sleeping  At discharge patient reported complete resolution of her headache. She is well-appearing, return precautions discussed with her at length. She is comfortable with the plan of outpatient follow-up as needed  ____________________________________________   FINAL  CLINICAL IMPRESSION(S) / ED DIAGNOSES  Final diagnoses:  Migraine without status migrainosus, not intractable, unspecified migraine type          Jene Everyobert Derin Matthes, MD 05/08/16 1942

## 2016-05-08 NOTE — ED Notes (Signed)
C/O headache since Friday. C/O pain bilaterally to sides of forehead and feels like eyes are throbbing. C/O sensitivity to light and sounds.

## 2016-05-08 NOTE — ED Notes (Signed)
Negative urine pregnancy

## 2016-05-08 NOTE — Discharge Instructions (Signed)

## 2016-08-10 ENCOUNTER — Encounter: Payer: Self-pay | Admitting: Emergency Medicine

## 2016-08-10 ENCOUNTER — Emergency Department: Payer: Self-pay

## 2016-08-10 ENCOUNTER — Emergency Department
Admission: EM | Admit: 2016-08-10 | Discharge: 2016-08-10 | Disposition: A | Discharge status: Home / Self Care | Payer: Self-pay | Attending: Student | Admitting: Student

## 2016-08-10 DIAGNOSIS — Z87891 Personal history of nicotine dependence: Secondary | ICD-10-CM | POA: Insufficient documentation

## 2016-08-10 DIAGNOSIS — Z791 Long term (current) use of non-steroidal anti-inflammatories (NSAID): Secondary | ICD-10-CM | POA: Insufficient documentation

## 2016-08-10 DIAGNOSIS — Z79899 Other long term (current) drug therapy: Secondary | ICD-10-CM | POA: Insufficient documentation

## 2016-08-10 DIAGNOSIS — M7652 Patellar tendinitis, left knee: Secondary | ICD-10-CM | POA: Insufficient documentation

## 2016-08-10 MED ORDER — TRAMADOL HCL 50 MG PO TABS: 50.0000 mg | ORAL_TABLET | Freq: Four times a day (QID) | ORAL | 0 refills | Status: DC | PRN

## 2016-08-10 MED ORDER — NAPROXEN 500 MG PO TABS: 500.0000 mg | ORAL_TABLET | Freq: Two times a day (BID) | ORAL | 0 refills | Status: DC

## 2016-08-10 NOTE — ED Provider Notes (Signed)
[facility] Emergency Department Provider Note   ____________________________________________   First MD Initiated Contact with Patient 08/10/16 1434     (approximate)  I have reviewed the triage vital signs and the nursing notes.   HISTORY  Chief Complaint Knee Pain    HPI Lisa Booker is a 25 y.o. female patient complain of continuing and increasing left knee pain for 1 week. Patient states no provocative incident for her complaint. Patient state her work requires prolonged standing but is normal. Patient also states that when she is off she does walk 30-40 minutes a day. Patient denies any redness and swelling to the legs. Patient state pain is a 6/10. Patient described a pain as "achy". No palliative measures except for Tylenol and ibuprofen.   Past Medical History:  Diagnosis Date  . Ovarian cyst     There are no active problems to display for this patient.   Past Surgical History:  Procedure Laterality Date  . TUBAL LIGATION      Prior to Admission medications   Medication Sig Start Date End Date Taking? Authorizing Provider  butalbital-acetaminophen-caffeine (FIORICET) 50-325-40 MG tablet Take 1-2 tablets by mouth every 6 (six) hours as needed for headache. 05/08/16 05/08/17  Jene Everyobert Kinner, MD  dibucaine (NUPERCAINAL) 1 % ointment Apply topically 3 (three) times daily as needed for pain. 07/19/15   Governor Rooksebecca Lord, MD  fluconazole (DIFLUCAN) 150 MG tablet Take 1 tablet (150 mg total) by mouth daily. 05/17/15   Evangeline Dakinharles M Beers, PA-C  HYDROcodone-acetaminophen (NORCO/VICODIN) 5-325 MG per tablet Take 1 tablet by mouth every 6 (six) hours as needed for moderate pain. 07/19/15   Governor Rooksebecca Lord, MD  metroNIDAZOLE (FLAGYL) 500 MG tablet Take 1 tablet (500 mg total) by mouth 2 (two) times daily. 07/19/15   Governor Rooksebecca Lord, MD  naproxen (NAPROSYN) 500 MG tablet Take 1 tablet (500 mg total) by mouth 2 (two) times daily with a meal. 01/08/16   Delorise RoyalsJonathan D Cuthriell,  PA-C  naproxen (NAPROSYN) 500 MG tablet Take 1 tablet (500 mg total) by mouth 2 (two) times daily with a meal. 02/20/16   Joni Reiningonald K Lucile Hillmann, PA-C  naproxen (NAPROSYN) 500 MG tablet Take 1 tablet (500 mg total) by mouth 2 (two) times daily with a meal. 08/10/16   Joni Reiningonald K Maisey Deandrade, PA-C  phenazopyridine (PYRIDIUM) 200 MG tablet Take 1 tablet (200 mg total) by mouth 3 (three) times daily as needed for pain. 05/17/15   Charmayne Sheerharles M Beers, PA-C  sulfamethoxazole-trimethoprim (BACTRIM DS,SEPTRA DS) 800-160 MG per tablet Take 1 tablet by mouth 2 (two) times daily. 07/19/15   Governor Rooksebecca Lord, MD  traMADol (ULTRAM) 50 MG tablet Take 1 tablet (50 mg total) by mouth every 6 (six) hours as needed for moderate pain. 02/20/16   Joni Reiningonald K Milledge Gerding, PA-C  traMADol (ULTRAM) 50 MG tablet Take 1 tablet (50 mg total) by mouth every 6 (six) hours as needed for moderate pain. 08/10/16   Joni Reiningonald K Ivy Puryear, PA-C    Allergies   No family history on file.  Social History Social History  Substance Use Topics  . Smoking status: Former Smoker    Packs/day: 0.50    Types: Cigarettes  . Smokeless tobacco: Never Used  . Alcohol use Yes    Review of Systems Constitutional: No fever/chills Eyes: No visual changes. ENT: No sore throat. Cardiovascular: Denies chest pain. Respiratory: Denies shortness of breath. Gastrointestinal: No abdominal pain.  No nausea, no vomiting.  No diarrhea.  No constipation. Genitourinary: Negative  for dysuria. Musculoskeletal: Left knee and upper leg pain  Skin: Negative for rash. Neurological: Negative for headaches, focal weakness or numbness.    ____________________________________________   PHYSICAL EXAM:  VITAL SIGNS: ED Triage Vitals  Enc Vitals Group     BP 08/10/16 1425 116/65     Pulse Rate 08/10/16 1425 88     Resp 08/10/16 1425 16     Temp 08/10/16 1425 98.5 F (36.9 C)     Temp Source 08/10/16 1425 Oral     SpO2 08/10/16 1425 99 %     Weight 08/10/16 1426 110 lb (49.9 kg)      Height 08/10/16 1426 5\' 3"  (1.6 m)     Head Circumference --      Peak Flow --      Pain Score 08/10/16 1426 6     Pain Loc --      Pain Edu? --      Excl. in GC? --     Constitutional: Alert and oriented. Well appearing and in no acute distress. Eyes: Conjunctivae are normal. PERRL. EOMI. Head: Atraumatic. Nose: No congestion/rhinnorhea. Mouth/Throat: Mucous membranes are moist.  Oropharynx non-erythematous. Neck: No stridor.  No cervical spine tenderness to palpation. Hematological/Lymphatic/Immunilogical: No cervical lymphadenopathy. Cardiovascular: Normal rate, regular rhythm. Grossly normal heart sounds.  Good peripheral circulation. Respiratory: Normal respiratory effort.  No retractions. Lungs CTAB. Gastrointestinal: Soft and nontender. No distention. No abdominal bruits. No CVA tenderness. Musculoskeletal: No obvious deformity to the left knee. Patient has some moderate crepitus with palpation of the patella. Patient had moderate guarding palpation of the tibial plateau.  Neurologic:  Normal speech and language. No gross focal neurologic deficits are appreciated. No gait instability. Skin:  Skin is warm, dry and intact. No rash noted. Psychiatric: Mood and affect are normal. Speech and behavior are normal.  ____________________________________________   LABS (all labs ordered are listed, but only abnormal results are displayed)  Labs Reviewed - No data to display ____________________________________________  EKG   ____________________________________________  RADIOLOGY no acute findings x-ray of the left knee. ____________________________________________   PROCEDURES  Procedure(s) performed: None  Procedures  Critical Care performed: No  ____________________________________________   INITIAL IMPRESSION / ASSESSMENT AND PLAN / ED COURSE  Pertinent labs & imaging results that were available during my care of the patient were reviewed by me and considered in  my medical decision making (see chart for details).  Patellar tendinitis. Discussed x-ray finding with patient. Patient given discharge care instructions. Patient given a prescription for naproxen and tramadol. Patient given a work note for 2 days. [facility] advised [facility].  Clinical Course     ____________________________________________   FINAL CLINICAL IMPRESSION(S) / ED DIAGNOSES  Final diagnoses:  Patellar tendinitis of left knee      NEW MEDICATIONS STARTED DURING THIS VISIT:  New Prescriptions   NAPROXEN (NAPROSYN) 500 MG TABLET    Take 1 tablet (500 mg total) by mouth 2 (two) times daily with a meal.   TRAMADOL (ULTRAM) 50 MG TABLET    Take 1 tablet (50 mg total) by mouth every 6 (six) hours as needed for moderate pain.     Note:  This document was prepared using Dragon voice recognition software and may include unintentional dictation errors.    Joni Reining, PA-C 08/10/16 1521    Gayla Doss, MD 08/11/16 [phone]

## 2016-08-10 NOTE — ED Triage Notes (Signed)
C/o left knee pain x 1 wk.  No redness or swelling noted, +PMS

## 2016-08-10 NOTE — ED Notes (Signed)
Pt reports left knee - started hurting last Thursday - denies injury - pt is able to bear weight but pain is increasing - has treated with asa and tyl - swelling noted to left knee cap with pain radiating into shin and ankle

## 2016-08-26 ENCOUNTER — Emergency Department
Admission: EM | Admit: 2016-08-26 | Discharge: 2016-08-26 | Disposition: A | Discharge status: Home / Self Care | Payer: Self-pay | Attending: Emergency Medicine | Admitting: Emergency Medicine

## 2016-08-26 ENCOUNTER — Emergency Department: Payer: Self-pay

## 2016-08-26 ENCOUNTER — Encounter: Payer: Self-pay | Admitting: Emergency Medicine

## 2016-08-26 DIAGNOSIS — Y998 Other external cause status: Secondary | ICD-10-CM | POA: Insufficient documentation

## 2016-08-26 DIAGNOSIS — Y92830 Public park as the place of occurrence of the external cause: Secondary | ICD-10-CM | POA: Insufficient documentation

## 2016-08-26 DIAGNOSIS — S300XXA Contusion of lower back and pelvis, initial encounter: Secondary | ICD-10-CM | POA: Insufficient documentation

## 2016-08-26 DIAGNOSIS — Z87891 Personal history of nicotine dependence: Secondary | ICD-10-CM | POA: Insufficient documentation

## 2016-08-26 DIAGNOSIS — W1789XA Other fall from one level to another, initial encounter: Secondary | ICD-10-CM | POA: Insufficient documentation

## 2016-08-26 DIAGNOSIS — Y9389 Activity, other specified: Secondary | ICD-10-CM | POA: Insufficient documentation

## 2016-08-26 DIAGNOSIS — Z79899 Other long term (current) drug therapy: Secondary | ICD-10-CM | POA: Insufficient documentation

## 2016-08-26 LAB — POCT PREGNANCY, URINE: PREG TEST UR: NEGATIVE

## 2016-08-26 MED ORDER — BACLOFEN 10 MG PO TABS: 10.0000 mg | ORAL_TABLET | Freq: Three times a day (TID) | ORAL | 0 refills | Status: AC | PRN

## 2016-08-26 MED ORDER — KETOROLAC TROMETHAMINE 30 MG/ML IJ SOLN
30.0000 mg | Freq: Once | INTRAMUSCULAR | Status: AC
  Administered 2016-08-26: 30 mg via INTRAMUSCULAR
  Filled 2016-08-26: qty 1

## 2016-08-26 MED ORDER — NAPROXEN 500 MG PO TABS: 500.0000 mg | ORAL_TABLET | Freq: Two times a day (BID) | ORAL | 0 refills | Status: DC

## 2016-08-26 NOTE — ED Triage Notes (Signed)
C/o pain to tailbone after falling in tunnel at playground. Denies loss of bowel or bladder.

## 2016-08-26 NOTE — ED Provider Notes (Signed)
[facility] Emergency Department Provider Note  ____________________________________________  Time seen: Approximately 6:23 PM  I have reviewed the triage vital signs and the nursing notes.   HISTORY  Chief Complaint Back Pain    HPI Lisa Booker is a 25 y.o. female , NAD, presents to the emergency department with lower back and buttock pain. States she was at a park with her children. Went to a tunnel in which she thought was a slide and thrust herself into the opening. Unfortunately the tunnel was part of a rock climbing wall and she landed on some of the protruding hand/foot grips. States she had immediate pain about the tailbone and buttock. Has had no numbness, weakness, tingling. No saddle paresthesias or loss of bowel or bladder control. Denies any open wounds or lacerations. Has not noted any redness, swelling about the area. Denies any upper back or neck pain. Has had no extremity pain. Denies chest pain, shortness breath, vomiting pain, nausea or vomiting.   Past Medical History:  Diagnosis Date  . Ovarian cyst     There are no active problems to display for this patient.   Past Surgical History:  Procedure Laterality Date  . TUBAL LIGATION      Prior to Admission medications   Medication Sig Start Date End Date Taking? Authorizing Provider  baclofen (LIORESAL) 10 MG tablet Take 1 tablet (10 mg total) by mouth 3 (three) times daily as needed for muscle spasms. 08/26/16 09/02/16  Jami L Hagler, PA-C  butalbital-acetaminophen-caffeine (FIORICET) 50-325-40 MG tablet Take 1-2 tablets by mouth every 6 (six) hours as needed for headache. 05/08/16 05/08/17  Jene Every, MD  dibucaine (NUPERCAINAL) 1 % ointment Apply topically 3 (three) times daily as needed for pain. 07/19/15   Governor Rooks, MD  fluconazole (DIFLUCAN) 150 MG tablet Take 1 tablet (150 mg total) by mouth daily. 05/17/15   Evangeline Dakin, PA-C  HYDROcodone-acetaminophen (NORCO/VICODIN)  5-325 MG per tablet Take 1 tablet by mouth every 6 (six) hours as needed for moderate pain. 07/19/15   Governor Rooks, MD  metroNIDAZOLE (FLAGYL) 500 MG tablet Take 1 tablet (500 mg total) by mouth 2 (two) times daily. 07/19/15   Governor Rooks, MD  naproxen (NAPROSYN) 500 MG tablet Take 1 tablet (500 mg total) by mouth 2 (two) times daily with a meal. 08/26/16   Jami L Hagler, PA-C  phenazopyridine (PYRIDIUM) 200 MG tablet Take 1 tablet (200 mg total) by mouth 3 (three) times daily as needed for pain. 05/17/15   Charmayne Sheer Beers, PA-C  sulfamethoxazole-trimethoprim (BACTRIM DS,SEPTRA DS) 800-160 MG per tablet Take 1 tablet by mouth 2 (two) times daily. 07/19/15   Governor Rooks, MD  traMADol (ULTRAM) 50 MG tablet Take 1 tablet (50 mg total) by mouth every 6 (six) hours as needed for moderate pain. 02/20/16   Joni Reining, PA-C  traMADol (ULTRAM) 50 MG tablet Take 1 tablet (50 mg total) by mouth every 6 (six) hours as needed for moderate pain. 08/10/16   Joni Reining, PA-C    Allergies Zofran Frazier Richards hcl]  History reviewed. No pertinent family history.  Social History Social History  Substance Use Topics  . Smoking status: Former Smoker    Packs/day: 0.50    Types: Cigarettes  . Smokeless tobacco: Never Used  . Alcohol use Yes     Review of Systems  Constitutional: No fatigue Cardiovascular: No chest pain. Respiratory: No shortness of breath.  Gastrointestinal: No abdominal pain.  No nausea, vomiting.  Musculoskeletal: Positive for back pain.  Skin: Negative for rash, redness, bruising, swelling, or wounds or lacerations. Neurological: Negative for numbness, weakness, tingling. No saddle paresthesias or loss of bowel or bladder control. 10-point ROS otherwise negative.  ____________________________________________   PHYSICAL EXAM:  VITAL SIGNS: ED Triage Vitals  Enc Vitals Group     BP 08/26/16 1752 (!) 103/50     Pulse Rate 08/26/16 1752 66     Resp 08/26/16 1752 18      Temp 08/26/16 1752 98.8 F (37.1 C)     Temp Source 08/26/16 1752 Oral     SpO2 08/26/16 1752 96 %     Weight 08/26/16 1750 113 lb (51.3 kg)     Height 08/26/16 1750 5\' 3"  (1.6 m)     Head Circumference --      Peak Flow --      Pain Score 08/26/16 1751 10     Pain Loc --      Pain Edu? --      Excl. in GC? --      Constitutional: Alert and oriented. Well appearing and in no acute distress. Eyes: Conjunctivae are normal. Head: Atraumatic. Cardiovascular:  Good peripheral circulation. Respiratory: Normal respiratory effort without tachypnea or retractions. Musculoskeletal: Moderate tenderness to palpation of the sacral spine without bony deformities or step-offs. No lumbar tenderness noted. No muscle spasms. Neurologic:  Normal speech and language. No gross focal neurologic deficits are appreciated. Sensation to light touch grossly intact by bilateral lower extremities Skin:  Skin is warm, dry and intact. No rash, redness, swelling, bruising, open wounds or lacerations noted. Psychiatric: Mood and affect are normal. Speech and behavior are normal. Patient exhibits appropriate insight and judgement.   ____________________________________________   LABS (all labs ordered are listed, but only abnormal results are displayed)  Labs Reviewed  POC URINE PREG, ED  POCT PREGNANCY, URINE   ____________________________________________  EKG  None ____________________________________________  RADIOLOGY I, Ernestene Kiel Hagler, personally viewed and evaluated these images (plain radiographs) as part of my medical decision making, as well as reviewing the written report by the radiologist.  Dg Sacrum/coccyx  Result Date: 08/26/2016 CLINICAL DATA:  Coccygeal pain after falling in tunnel at playground. Initial encounter. EXAM: SACRUM AND COCCYX - 2+ VIEW COMPARISON:  CT of the abdomen and pelvis from 10/25/2009 FINDINGS: The sacrum and coccyx are grossly unremarkable in appearance. There is no  definite evidence of fracture or dislocation. The lower lumbar spine is grossly unremarkable. The visualized bowel gas pattern is within normal limits. The sacroiliac joints are unremarkable in appearance. IMPRESSION: No evidence of fracture or dislocation. Electronically Signed   By: Roanna Raider M.D.   On: 08/26/2016 18:53    ____________________________________________    PROCEDURES  Procedure(s) performed: None   Procedures   Medications  ketorolac (TORADOL) 30 MG/ML injection 30 mg (30 mg Intramuscular Given 08/26/16 1826)   Patient notes improvement of pain since being given Toradol.  ____________________________________________   INITIAL IMPRESSION / ASSESSMENT AND PLAN / ED COURSE  Pertinent labs & imaging results that were available during my care of the patient were reviewed by me and considered in my medical decision making (see chart for details).  Clinical Course    Patient's diagnosis is consistent with Contusion of coccyx. Patient will be discharged home with prescriptions for Naprosyn and baclofen to take as directed. Patient is to apply ice to the affected area 20 minutes 3-4 times daily completely light range of motion stretching exercises as  discussed. Patient was given a work note to excuse worsening tomorrow to allow healing. Patient is to [facility] provider [facility] if symptoms persist past this treatment course. Patient is given ED precautions to return to the ED for any worsening or new symptoms.    ____________________________________________  FINAL CLINICAL IMPRESSION(S) / ED DIAGNOSES  Final diagnoses:  Contusion of coccyx, initial encounter      NEW MEDICATIONS STARTED DURING THIS VISIT:  New Prescriptions   BACLOFEN (LIORESAL) 10 MG TABLET    Take 1 tablet (10 mg total) by mouth 3 (three) times daily as needed for muscle spasms.   NAPROXEN (NAPROSYN) 500 MG TABLET    Take 1 tablet (500 mg total) by  mouth 2 (two) times daily with a meal.         Hope PigeonJami L Hagler, PA-C 08/26/16 1918    Nita Sicklearolina Veronese, MD 08/27/16 [id number]

## 2016-08-26 NOTE — ED Notes (Signed)
E sig inoperable, pt verbalized consent

## 2016-11-29 ENCOUNTER — Emergency Department: Payer: Self-pay

## 2016-11-29 ENCOUNTER — Emergency Department
Admission: EM | Admit: 2016-11-29 | Discharge: 2016-11-29 | Disposition: A | Discharge status: Home / Self Care | Payer: Self-pay | Attending: Emergency Medicine | Admitting: Emergency Medicine

## 2016-11-29 ENCOUNTER — Encounter: Payer: Self-pay | Admitting: Emergency Medicine

## 2016-11-29 DIAGNOSIS — W1809XA Striking against other object with subsequent fall, initial encounter: Secondary | ICD-10-CM | POA: Insufficient documentation

## 2016-11-29 DIAGNOSIS — S0990XA Unspecified injury of head, initial encounter: Secondary | ICD-10-CM | POA: Insufficient documentation

## 2016-11-29 DIAGNOSIS — Z79899 Other long term (current) drug therapy: Secondary | ICD-10-CM | POA: Insufficient documentation

## 2016-11-29 DIAGNOSIS — F17210 Nicotine dependence, cigarettes, uncomplicated: Secondary | ICD-10-CM | POA: Insufficient documentation

## 2016-11-29 DIAGNOSIS — S0532XA Ocular laceration without prolapse or loss of intraocular tissue, left eye, initial encounter: Secondary | ICD-10-CM | POA: Insufficient documentation

## 2016-11-29 DIAGNOSIS — Y999 Unspecified external cause status: Secondary | ICD-10-CM | POA: Insufficient documentation

## 2016-11-29 DIAGNOSIS — Y92010 Kitchen of single-family (private) house as the place of occurrence of the external cause: Secondary | ICD-10-CM | POA: Insufficient documentation

## 2016-11-29 DIAGNOSIS — Y9389 Activity, other specified: Secondary | ICD-10-CM | POA: Insufficient documentation

## 2016-11-29 LAB — URINALYSIS, COMPLETE (UACMP) WITH MICROSCOPIC
Bilirubin Urine: NEGATIVE
Glucose, UA: NEGATIVE mg/dL
Ketones, ur: NEGATIVE mg/dL
NITRITE: NEGATIVE
Protein, ur: NEGATIVE mg/dL
SPECIFIC GRAVITY, URINE: 1.012 (ref 1.005–1.030)
pH: 5 (ref 5.0–8.0)

## 2016-11-29 LAB — CBC
HEMATOCRIT: 45.1 % (ref 35.0–47.0)
Hemoglobin: 15.4 g/dL (ref 12.0–16.0)
MCH: 31.1 pg (ref 26.0–34.0)
MCHC: 34 g/dL (ref 32.0–36.0)
MCV: 91.2 fL (ref 80.0–100.0)
PLATELETS: 208 10*3/uL (ref 150–440)
RBC: 4.95 MIL/uL (ref 3.80–5.20)
RDW: 14.1 % (ref 11.5–14.5)
WBC: 15.5 10*3/uL — ABNORMAL HIGH (ref 3.6–11.0)

## 2016-11-29 LAB — BASIC METABOLIC PANEL
Anion gap: 6 (ref 5–15)
BUN: <5 mg/dL — ABNORMAL LOW (ref 6–20)
CALCIUM: 8.7 mg/dL — AB (ref 8.9–10.3)
CO2: 25 mmol/L (ref 22–32)
CREATININE: 0.76 mg/dL (ref 0.44–1.00)
Chloride: 107 mmol/L (ref 101–111)
GFR calc Af Amer: >60 mL/min (ref >60)
GLUCOSE: 111 mg/dL — AB (ref 65–99)
Potassium: 4 mmol/L (ref 3.5–5.1)
Sodium: 138 mmol/L (ref 135–145)

## 2016-11-29 LAB — PREGNANCY, URINE: PREG TEST UR: NEGATIVE

## 2016-11-29 MED ORDER — SODIUM CHLORIDE 0.9 % IV BOLUS (SEPSIS)
1000.0000 mL | Freq: Once | INTRAVENOUS | Status: AC
  Administered 2016-11-29: 1000 mL via INTRAVENOUS

## 2016-11-29 MED ORDER — KETOROLAC TROMETHAMINE 30 MG/ML IJ SOLN
30.0000 mg | Freq: Once | INTRAMUSCULAR | Status: AC
  Administered 2016-11-29: 30 mg via INTRAVENOUS
  Filled 2016-11-29: qty 1

## 2016-11-29 MED ORDER — TRAMADOL HCL 50 MG PO TABS: 50.0000 mg | ORAL_TABLET | Freq: Four times a day (QID) | ORAL | 0 refills | Status: DC | PRN

## 2016-11-29 MED ORDER — SODIUM CHLORIDE 0.9 % IV BOLUS (SEPSIS): 500.0000 mL | Freq: Once | INTRAVENOUS | Status: DC

## 2016-11-29 NOTE — ED Provider Notes (Signed)
Time Seen: Approximately 1435  I have reviewed the triage notes  Chief Complaint: Head Injury and Loss of Consciousness   History of Present Illness: Lisa Booker is a 26 y.o. female who presents after a syncopal fall today. Patient states that she was at home in the kitchen and when she got up off of a chair and felt herself losing consciousness. She states she felt very lightheaded, dizzy and had some spotty vision and fell primarily hitting her head on the floor. She has some headache and some posterior neck discomfort. She denies any focal weakness. Patient denies any chest pain or shortness of breath. She has been able to ambulate. She denies any leg pain or swelling.   Past Medical History:  Diagnosis Date  . Ovarian cyst     There are no active problems to display for this patient.   Past Surgical History:  Procedure Laterality Date  . TUBAL LIGATION      Past Surgical History:  Procedure Laterality Date  . TUBAL LIGATION      Current Outpatient Rx  . Order #: [id number] Class: Print  . Order #: [id number] Class: Print  . Order #: [id number] Class: Print  . Order #: [id number] Class: Print  . Order #: [id number] Class: Print  . Order #: [id number] Class: Print  . Order #: [id number] Class: Print  . Order #: [id number] Class: Print  . Order #: [id number] Class: Print  . Order #: [id number] Class: Print    Allergies:  Zofran Frazier Richards[ondansetron hcl]  Family History: No family history on file.  Social History: Social History  Substance Use Topics  . Smoking status: Current Every Day Smoker    Packs/day: 0.50    Types: Cigarettes  . Smokeless tobacco: Never Used  . Alcohol use Yes     Review of Systems:   10 point review of systems was performed and was otherwise negative:  Constitutional: No fever Eyes: No Current visual disturbances ENT: No sore throat, ear pain Cardiac: No chest pain Respiratory: No shortness of breath, wheezing, or stridor Abdomen: No abdominal  pain, no vomiting, No diarrhea Endocrine: No weight loss, No night sweats Extremities: No peripheral edema, cyanosis Skin: No rashes, easy bruising Neurologic: No focal weakness, trouble with speech or swollowing Urologic: No dysuria, Hematuria, or urinary frequency   Physical Exam:  ED Triage Vitals  Enc Vitals Group     BP 11/29/16 1156 113/68     Pulse Rate 11/29/16 1156 79     Resp 11/29/16 1156 16     Temp 11/29/16 1150 98.1 F (36.7 C)     Temp Source 11/29/16 1150 Oral     SpO2 11/29/16 1156 100 %     Weight --      Height --      Head Circumference --      Peak Flow --      Pain Score 11/29/16 1448 10     Pain Loc --      Pain Edu? --      Excl. in GC? --     General: Awake , Alert , and Oriented times 3; GCS 15 Head: Normal cephalic , She has a abrasion type laceration left corner of her eye without any ocular involvement approximately 2 cm in size Eyes: Pupils equal , round, reactive to light. Extraocular eye movements are intact no papilledema Nose/Throat: No nasal drainage, patent upper airway without erythema or exudate.  Neck: Tender posteriorly especially over the central C-spine area and also toward the left lateralization at  the base of the occiput. No neuropraxia Lungs: Clear to ascultation without wheezes , rhonchi, or rales Heart: Regular rate, regular rhythm without murmurs , gallops , or rubs Abdomen: Soft, non tender without rebound, guarding , or rigidity; bowel sounds positive and symmetric in all 4 quadrants. No organomegaly .        Extremities: 2 plus symmetric pulses. No edema, clubbing or cyanosis Neurologic: normal ambulation, Motor symmetric without deficits, sensory intact Skin: warm, dry, no rashes   Labs:   All laboratory work was reviewed including any pertinent negatives or positives listed below:  Labs Reviewed  BASIC METABOLIC PANEL - Abnormal; Notable for the following:       Result Value   Glucose, Bld 111 (*)    BUN <5 (*)     Calcium 8.7 (*)    All other components within normal limits  CBC - Abnormal; Notable for the following:    WBC 15.5 (*)    All other components within normal limits  URINALYSIS, COMPLETE (UACMP) WITH MICROSCOPIC - Abnormal; Notable for the following:    Color, Urine YELLOW (*)    APPearance HAZY (*)    Hgb urine dipstick LARGE (*)    Leukocytes, UA MODERATE (*)    Bacteria, UA RARE (*)    Squamous Epithelial / LPF 6-30 (*)    All other components within normal limits  PREGNANCY, URINE  CBG MONITORING, ED    EKG:  ED ECG REPORT I, Jennye Moccasin, the attending physician, personally viewed and interpreted this ECG.  Date: 11/29/2016 EKG Time: 1155 Rate:72 Rhythm: normal sinus rhythm with a sinus arrhythmia QRS Axis: normal Intervals: normal ST/T Wave abnormalities: normal Conduction Disturbances: none Narrative Interpretation: unremarkable Large artifact No acute ischemic changes, negative bronchitis   Radiology:  "Ct Head Wo Contrast  Result Date: 11/29/2016 CLINICAL DATA:  Syncope with fall EXAM: CT HEAD WITHOUT CONTRAST CT CERVICAL SPINE WITHOUT CONTRAST TECHNIQUE: Multidetector CT imaging of the head and cervical spine was performed following the standard protocol without intravenous contrast. Multiplanar CT image reconstructions of the cervical spine were also generated. COMPARISON:  Head CT June 08, 2005 FINDINGS: CT HEAD FINDINGS Brain: The ventricles are normal in size and configuration. The right lateral ventricle is larger than the left lateral ventricle, a stable finding compared to prior study from 2006. There is no intracranial mass, hemorrhage, extra-axial fluid collection, or midline shift. Gray-white compartments appear normal. No acute infarct evident. Vascular: There is no hyperdense vessel. There is minimal calcification in the cavernous carotid arteries bilaterally. Skull: The bony calvarium appears intact. Sinuses/Orbits: There is mild mucosal thickening in  several ethmoid air cells bilaterally. There is opacification in the lateral anterior right sphenoid sinus. Frontal sinuses are somewhat hypoplastic. Visualized paranasal sinuses elsewhere clear. Visualized orbits appear symmetric bilaterally. Other: Mastoid air cells are clear. CT CERVICAL SPINE FINDINGS Alignment: There is no spondylolisthesis. Skull base and vertebrae: Skull base and craniocervical junction regions appear normal. No fracture. No blastic or lytic bone lesions. Soft tissues and spinal canal: Prevertebral soft tissues and predental space regions are normal. There is no evident cord or canal hematoma. No evident spinal stenosis. No paraspinous lesions are evident. Disc levels: Disc spaces appear normal. No nerve root edema or effacement. No disc extrusion or stenosis. Upper chest: Visualized lung apices are clear. Other: None IMPRESSION: CT head: No intracranial mass, hemorrhage, or extra-axial fluid collection. Gray-white compartments are normal. Ventricle appearance is stable compared to prior study from 2006. There are  areas of paranasal sinus disease. There is minimal calcification in the cavernous carotid arteries bilaterally. CT cervical spine: No fracture or spondylolisthesis. No evident arthropathic change. Electronically Signed   By: Bretta Bang III M.D.   On: 11/29/2016 16:59   Ct Cervical Spine Wo Contrast  Result Date: 11/29/2016 CLINICAL DATA:  Syncope with fall EXAM: CT HEAD WITHOUT CONTRAST CT CERVICAL SPINE WITHOUT CONTRAST TECHNIQUE: Multidetector CT imaging of the head and cervical spine was performed following the standard protocol without intravenous contrast. Multiplanar CT image reconstructions of the cervical spine were also generated. COMPARISON:  Head CT June 08, 2005 FINDINGS: CT HEAD FINDINGS Brain: The ventricles are normal in size and configuration. The right lateral ventricle is larger than the left lateral ventricle, a stable finding compared to prior study  from 2006. There is no intracranial mass, hemorrhage, extra-axial fluid collection, or midline shift. Gray-white compartments appear normal. No acute infarct evident. Vascular: There is no hyperdense vessel. There is minimal calcification in the cavernous carotid arteries bilaterally. Skull: The bony calvarium appears intact. Sinuses/Orbits: There is mild mucosal thickening in several ethmoid air cells bilaterally. There is opacification in the lateral anterior right sphenoid sinus. Frontal sinuses are somewhat hypoplastic. Visualized paranasal sinuses elsewhere clear. Visualized orbits appear symmetric bilaterally. Other: Mastoid air cells are clear. CT CERVICAL SPINE FINDINGS Alignment: There is no spondylolisthesis. Skull base and vertebrae: Skull base and craniocervical junction regions appear normal. No fracture. No blastic or lytic bone lesions. Soft tissues and spinal canal: Prevertebral soft tissues and predental space regions are normal. There is no evident cord or canal hematoma. No evident spinal stenosis. No paraspinous lesions are evident. Disc levels: Disc spaces appear normal. No nerve root edema or effacement. No disc extrusion or stenosis. Upper chest: Visualized lung apices are clear. Other: None IMPRESSION: CT head: No intracranial mass, hemorrhage, or extra-axial fluid collection. Gray-white compartments are normal. Ventricle appearance is stable compared to prior study from 2006. There are areas of paranasal sinus disease. There is minimal calcification in the cavernous carotid arteries bilaterally. CT cervical spine: No fracture or spondylolisthesis. No evident arthropathic change. Electronically Signed   By: Bretta Bang III M.D.   On: 11/29/2016 16:59  "  I personally reviewed the radiologic studies   ED Course:  Patient's P waves are hard to identified her EKG but appears to be of a normal sinus rhythm. This unlikely to be atrial fibrillation especially with a normal heart rate.  The patient's laboratory work was otherwise within normal limits and the patient was discharged after she had Steri-Strips applied to her facial laceration. His 1 cm with only a small area of separation and I felt was too close to her eye to use Dermabond. He wasn't large enough for stitches. Patient received IV fluids and felt symptomatically improved and most likely had a syncopal episode after donating plasma today.     Assessment: * Syncope Mild acute closed head injury Facial laceration     Plan: * Outpatient Patient was advised to return immediately if condition worsens. Patient was advised to follow up with their primary care physician or other specialized physicians involved in their outpatient care. The patient and/or family member/power of attorney had laboratory results reviewed at the bedside. All questions and concerns were addressed and appropriate discharge instructions were distributed by the nursing staff.             Jennye Moccasin, MD 11/29/16 1754

## 2016-11-29 NOTE — ED Triage Notes (Signed)
Says gave plasma this am.  Lisa FlesherWent home and was in kitchen and she passed out hitting left eyebrow area on counter.  Felt dizziness prior and vision was splotchy.  After that she now has headache.

## 2016-11-30 LAB — GLUCOSE, CAPILLARY: GLUCOSE-CAPILLARY: 101 mg/dL — AB (ref 65–99)

## 2017-04-29 ENCOUNTER — Encounter: Payer: Self-pay | Admitting: Emergency Medicine

## 2017-04-29 ENCOUNTER — Emergency Department
Admission: EM | Admit: 2017-04-29 | Discharge: 2017-04-29 | Disposition: A | Discharge status: Home / Self Care | Payer: Medicaid Other | Attending: Emergency Medicine | Admitting: Emergency Medicine

## 2017-04-29 DIAGNOSIS — F17210 Nicotine dependence, cigarettes, uncomplicated: Secondary | ICD-10-CM | POA: Diagnosis not present

## 2017-04-29 DIAGNOSIS — R519 Headache, unspecified: Secondary | ICD-10-CM

## 2017-04-29 DIAGNOSIS — R51 Headache: Secondary | ICD-10-CM | POA: Diagnosis present

## 2017-04-29 HISTORY — DX: Migraine, unspecified, not intractable, without status migrainosus: G43.909

## 2017-04-29 LAB — URINE DRUG SCREEN, QUALITATIVE (ARMC ONLY)
Amphetamines, Ur Screen: NOT DETECTED
Barbiturates, Ur Screen: NOT DETECTED
Benzodiazepine, Ur Scrn: NOT DETECTED
Cannabinoid 50 Ng, Ur ~~LOC~~: POSITIVE — AB
Cocaine Metabolite,Ur ~~LOC~~: NOT DETECTED
MDMA (ECSTASY) UR SCREEN: NOT DETECTED
Methadone Scn, Ur: NOT DETECTED
OPIATE, UR SCREEN: NOT DETECTED
PHENCYCLIDINE (PCP) UR S: NOT DETECTED
Tricyclic, Ur Screen: NOT DETECTED

## 2017-04-29 LAB — URINALYSIS, COMPLETE (UACMP) WITH MICROSCOPIC
Bilirubin Urine: NEGATIVE
Glucose, UA: NEGATIVE mg/dL
Ketones, ur: 5 mg/dL — AB
Leukocytes, UA: NEGATIVE
Nitrite: NEGATIVE
PROTEIN: NEGATIVE mg/dL
SPECIFIC GRAVITY, URINE: 1.002 — AB (ref 1.005–1.030)
pH: 7 (ref 5.0–8.0)

## 2017-04-29 LAB — CBC WITH DIFFERENTIAL/PLATELET
BASOS ABS: 0.1 10*3/uL (ref 0–0.1)
BASOS PCT: 1 %
Eosinophils Absolute: 0.1 10*3/uL (ref 0–0.7)
Eosinophils Relative: 1 %
HEMATOCRIT: 39.6 % (ref 35.0–47.0)
HEMOGLOBIN: 13.9 g/dL (ref 12.0–16.0)
Lymphocytes Relative: 35 %
Lymphs Abs: 3.2 10*3/uL (ref 1.0–3.6)
MCH: 31.7 pg (ref 26.0–34.0)
MCHC: 35.1 g/dL (ref 32.0–36.0)
MCV: 90.2 fL (ref 80.0–100.0)
Monocytes Absolute: 0.5 10*3/uL (ref 0.2–0.9)
Monocytes Relative: 5 %
NEUTROS ABS: 5.4 10*3/uL (ref 1.4–6.5)
Neutrophils Relative %: 58 %
Platelets: 221 10*3/uL (ref 150–440)
RBC: 4.39 MIL/uL (ref 3.80–5.20)
RDW: 14 % (ref 11.5–14.5)
WBC: 9.2 10*3/uL (ref 3.6–11.0)

## 2017-04-29 LAB — COMPREHENSIVE METABOLIC PANEL
ALBUMIN: 4.8 g/dL (ref 3.5–5.0)
ALT: 18 U/L (ref 14–54)
AST: 26 U/L (ref 15–41)
Alkaline Phosphatase: 60 U/L (ref 38–126)
Anion gap: 10 (ref 5–15)
BILIRUBIN TOTAL: 0.7 mg/dL (ref 0.3–1.2)
BUN: 7 mg/dL (ref 6–20)
CO2: 23 mmol/L (ref 22–32)
Calcium: 9.6 mg/dL (ref 8.9–10.3)
Chloride: 105 mmol/L (ref 101–111)
Creatinine, Ser: 0.68 mg/dL (ref 0.44–1.00)
GFR calc Af Amer: >60 mL/min (ref >60)
GFR calc non Af Amer: >60 mL/min (ref >60)
GLUCOSE: 91 mg/dL (ref 65–99)
POTASSIUM: 3.3 mmol/L — AB (ref 3.5–5.1)
Sodium: 138 mmol/L (ref 135–145)
TOTAL PROTEIN: 8 g/dL (ref 6.5–8.1)

## 2017-04-29 LAB — POCT PREGNANCY, URINE: PREG TEST UR: NEGATIVE

## 2017-04-29 MED ORDER — KETOROLAC TROMETHAMINE 30 MG/ML IJ SOLN
30.0000 mg | Freq: Once | INTRAMUSCULAR | Status: AC
  Administered 2017-04-29: 30 mg via INTRAVENOUS
  Filled 2017-04-29: qty 1

## 2017-04-29 MED ORDER — DIPHENHYDRAMINE HCL 50 MG/ML IJ SOLN
25.0000 mg | Freq: Once | INTRAMUSCULAR | Status: AC
  Administered 2017-04-29: 25 mg via INTRAVENOUS
  Filled 2017-04-29: qty 1

## 2017-04-29 MED ORDER — BUTALBITAL-APAP-CAFFEINE 50-325-40 MG PO TABS: 1.0000 | ORAL_TABLET | Freq: Four times a day (QID) | ORAL | 0 refills | Status: AC | PRN

## 2017-04-29 MED ORDER — SODIUM CHLORIDE 0.9 % IV SOLN
Freq: Once | INTRAVENOUS | Status: AC
  Administered 2017-04-29: 15:00:00 via INTRAVENOUS

## 2017-04-29 MED ORDER — METOCLOPRAMIDE HCL 5 MG/ML IJ SOLN
10.0000 mg | Freq: Once | INTRAMUSCULAR | Status: AC
  Administered 2017-04-29: 10 mg via INTRAVENOUS
  Filled 2017-04-29: qty 2

## 2017-04-29 NOTE — ED Notes (Signed)
Pt states she developed some numbness in her right arm on way to the hospital, Dr. Lenard LancePaduchowski informed, no new orders at this time

## 2017-04-29 NOTE — ED Triage Notes (Signed)
Migraine since last Saturday states progressively worsened since Thursday. Pt takes ibuprofen for migraines.  + photosensitivity +nausea pt also reports vision changes due to migraine.  Pt states dizziness and vomiting started Thursday night.  Pt states the migraine started off as a normal migraine but states Thursday night is when her migraine pain changed.  Reports decreased appetite also.  C/o pulsating sensation.

## 2017-04-29 NOTE — ED Provider Notes (Signed)
[facility] Emergency Department Provider Note       Time seen: ----------------------------------------- 3:23 PM on 04/29/2017 -----------------------------------------     I have reviewed the triage vital signs and the nursing notes.   HISTORY   Chief Complaint Migraine    HPI Lisa Booker is a 26 y.o. female who presents to the ED for headaches since last Saturday that has progressively worsened since Thursday. Patient reports she normally takes ibuprofen for what she calls migraines. She's never been diagnosed with migraines, has had photophobia and nausea with some vision changes due to this headache. Patient states dizziness and vomiting started Thursday night. She does report being under increased stress and completely missed one night of sleep. She does not think she could be pregnant as she has had a tubal ligation.   Past Medical History:  Diagnosis Date  . Migraine   . Ovarian cyst     There are no active problems to display for this patient.   Past Surgical History:  Procedure Laterality Date  . TUBAL LIGATION      Allergies Zofran Frazier Richards hcl]  Social History Social History  Substance Use Topics  . Smoking status: Current Every Day Smoker    Packs/day: 0.50    Types: Cigarettes  . Smokeless tobacco: Never Used  . Alcohol use Yes    Review of Systems Constitutional: Negative for fever. Eyes: Positive for vision changes ENT:  Negative for congestion, sore throat Cardiovascular: Negative for chest pain. Respiratory: Negative for shortness of breath. Gastrointestinal: Negative for abdominal pain, positive for nausea and vomiting Musculoskeletal: Negative for back pain. Skin: Negative for rash. Neurological: Positive for headache  All systems negative/normal/unremarkable except as stated in the HPI  ____________________________________________   PHYSICAL EXAM:  VITAL SIGNS: ED Triage Vitals  Enc Vitals  Group     BP 04/29/17 1407 132/72     Pulse Rate 04/29/17 1407 62     Resp 04/29/17 1510 10     Temp 04/29/17 1407 98.6 F (37 C)     Temp Source 04/29/17 1407 Oral     SpO2 04/29/17 1407 95 %     Weight 04/29/17 1408 111 lb (50.3 kg)     Height 04/29/17 1408 5\' 3"  (1.6 m)     Head Circumference --      Peak Flow --      Pain Score 04/29/17 1410 8     Pain Loc --      Pain Edu? --      Excl. in GC? --    Constitutional: Alert and oriented. Well appearing and in no distress. Eyes: Conjunctivae are normal. Normal extraocular movements.Mild photophobia ENT   Head: Normocephalic and atraumatic.   Nose: No congestion/rhinnorhea.   Mouth/Throat: Mucous membranes are moist.   Neck: No stridor. Cardiovascular: Normal rate, regular rhythm. No murmurs, rubs, or gallops. Respiratory: Normal respiratory effort without tachypnea nor retractions. Breath sounds are clear and equal bilaterally. No wheezes/rales/rhonchi. Gastrointestinal: Soft and nontender. Normal bowel sounds Musculoskeletal: Nontender with normal range of motion in extremities. No lower extremity tenderness nor edema. Neurologic:  Normal speech and language. No gross focal neurologic deficits are appreciated.  Skin:  Skin is warm, dry and intact. No rash noted. Psychiatric: Mood and affect are normal. Speech and behavior are normal.  ____________________________________________  EKG: Interpreted by me. Sinus rhythm rate of 52 bpm, normal PR interval, normal QRS, normal QT.   ____________________________________________  ED COURSE:  Pertinent labs & imaging results  that were available during my care of the patient were reviewed by me and considered in my medical decision making (see chart for details). Patient presents for persistent headache, we will assess with labs and imaging as indicated.   Procedures ____________________________________________   LABS (pertinent positives/negatives)  Labs Reviewed   COMPREHENSIVE METABOLIC PANEL - Abnormal; Notable for the following:       Result Value   Potassium 3.3 (*)    All other components within normal limits  URINE DRUG SCREEN, QUALITATIVE ([facility] ONLY) - Abnormal; Notable for the following:    Cannabinoid 50 Ng, Ur [place] POSITIVE (*)    All other components within normal limits  URINALYSIS, COMPLETE (UACMP) WITH MICROSCOPIC - Abnormal; Notable for the following:    Color, Urine STRAW (*)    APPearance CLEAR (*)    Specific Gravity, Urine 1.002 (*)    Hgb urine dipstick MODERATE (*)    Ketones, ur 5 (*)    Bacteria, UA RARE (*)    Squamous Epithelial / LPF 0-5 (*)    All other components within normal limits  CBC WITH DIFFERENTIAL/PLATELET  POC URINE PREG, ED  POCT PREGNANCY, URINE   ____________________________________________  FINAL ASSESSMENT AND PLAN  Headache  Plan: Patient's labs were dictated above. Patient had presented for Persistent headache over the last week. IV headache cocktail was curative of her headache which is likely due to stress and sleep deprivation. She'll be discharged with headache medication and referred to her primary care doctor for follow-up.   Emily FilbertWilliams, Jonathan E, MD   Note: This note was generated in part or whole with voice recognition software. Voice recognition is usually quite accurate but there are transcription errors that can and very often do occur. I apologize for any typographical errors that were not detected and corrected.     Emily FilbertWilliams, Jonathan E, MD 04/29/17 [phone]

## 2017-04-29 NOTE — ED Notes (Signed)

## 2017-04-29 NOTE — ED Notes (Signed)
Purple and green tubes sent to lab with save labels

## 2017-05-24 ENCOUNTER — Encounter: Payer: Self-pay | Admitting: Emergency Medicine

## 2017-05-24 ENCOUNTER — Emergency Department: Payer: Medicaid Other

## 2017-05-24 ENCOUNTER — Emergency Department
Admission: EM | Admit: 2017-05-24 | Discharge: 2017-05-24 | Disposition: A | Discharge status: Home / Self Care | Payer: Medicaid Other | Attending: Emergency Medicine | Admitting: Emergency Medicine

## 2017-05-24 DIAGNOSIS — F17210 Nicotine dependence, cigarettes, uncomplicated: Secondary | ICD-10-CM | POA: Insufficient documentation

## 2017-05-24 DIAGNOSIS — N939 Abnormal uterine and vaginal bleeding, unspecified: Secondary | ICD-10-CM | POA: Insufficient documentation

## 2017-05-24 DIAGNOSIS — Z79899 Other long term (current) drug therapy: Secondary | ICD-10-CM | POA: Insufficient documentation

## 2017-05-24 DIAGNOSIS — R102 Pelvic and perineal pain: Secondary | ICD-10-CM | POA: Insufficient documentation

## 2017-05-24 LAB — URINALYSIS, COMPLETE (UACMP) WITH MICROSCOPIC
BACTERIA UA: NONE SEEN
Bilirubin Urine: NEGATIVE
Glucose, UA: NEGATIVE mg/dL
Ketones, ur: NEGATIVE mg/dL
Leukocytes, UA: NEGATIVE
Nitrite: NEGATIVE
PROTEIN: NEGATIVE mg/dL
RBC / HPF: NONE SEEN RBC/hpf (ref 0–5)
Specific Gravity, Urine: 1.005 (ref 1.005–1.030)
pH: 6 (ref 5.0–8.0)

## 2017-05-24 LAB — COMPREHENSIVE METABOLIC PANEL
ALBUMIN: 3.7 g/dL (ref 3.5–5.0)
ALK PHOS: 48 U/L (ref 38–126)
ALT: 15 U/L (ref 14–54)
ANION GAP: 8 (ref 5–15)
AST: 21 U/L (ref 15–41)
BUN: 5 mg/dL — AB (ref 6–20)
CALCIUM: 9.4 mg/dL (ref 8.9–10.3)
CO2: 27 mmol/L (ref 22–32)
Chloride: 103 mmol/L (ref 101–111)
Creatinine, Ser: 0.76 mg/dL (ref 0.44–1.00)
GFR calc Af Amer: >60 mL/min (ref >60)
GFR calc non Af Amer: >60 mL/min (ref >60)
GLUCOSE: 97 mg/dL (ref 65–99)
POTASSIUM: 3.4 mmol/L — AB (ref 3.5–5.1)
SODIUM: 138 mmol/L (ref 135–145)
Total Bilirubin: 0.3 mg/dL (ref 0.3–1.2)
Total Protein: 6.4 g/dL — ABNORMAL LOW (ref 6.5–8.1)

## 2017-05-24 LAB — CBC
HEMATOCRIT: 39.3 % (ref 35.0–47.0)
HEMOGLOBIN: 13.6 g/dL (ref 12.0–16.0)
MCH: 31.3 pg (ref 26.0–34.0)
MCHC: 34.6 g/dL (ref 32.0–36.0)
MCV: 90.6 fL (ref 80.0–100.0)
Platelets: 173 10*3/uL (ref 150–440)
RBC: 4.34 MIL/uL (ref 3.80–5.20)
RDW: 14.2 % (ref 11.5–14.5)
WBC: 9.7 10*3/uL (ref 3.6–11.0)

## 2017-05-24 LAB — POCT PREGNANCY, URINE: PREG TEST UR: NEGATIVE

## 2017-05-24 LAB — LIPASE, BLOOD: Lipase: 28 U/L (ref 11–51)

## 2017-05-24 MED ORDER — HYDROMORPHONE HCL 1 MG/ML IJ SOLN
1.0000 mg | Freq: Once | INTRAMUSCULAR | Status: AC
  Administered 2017-05-24: 1 mg via SUBCUTANEOUS
  Filled 2017-05-24: qty 1

## 2017-05-24 MED ORDER — NAPROXEN 500 MG PO TABS: 500.0000 mg | ORAL_TABLET | Freq: Two times a day (BID) | ORAL | 0 refills | Status: DC

## 2017-05-24 NOTE — ED Notes (Signed)
Patient transported to Ultrasound 

## 2017-05-24 NOTE — ED Triage Notes (Signed)
Pt c/o period that started yesterday but is not like her normal periods.  Also had LLQ pain that started Sunday and feels similar to her ovarian cysts. Nausea but no vomiting or diarrhea.  NAD. VSS.

## 2017-05-24 NOTE — Discharge Instructions (Signed)
Results for orders placed or performed during the hospital encounter of 05/24/17  Lipase, blood  Result Value Ref Range   Lipase 28 11 - 51 U/L  Comprehensive metabolic panel  Result Value Ref Range   Sodium 138 135 - 145 mmol/L   Potassium 3.4 (L) 3.5 - 5.1 mmol/L   Chloride 103 101 - 111 mmol/L   CO2 27 22 - 32 mmol/L   Glucose, Bld 97 65 - 99 mg/dL   BUN 5 (L) 6 - 20 mg/dL   Creatinine, Ser 0.860.76 0.44 - 1.00 mg/dL   Calcium 9.4 8.9 - 57.810.3 mg/dL   Total Protein 6.4 (L) 6.5 - 8.1 g/dL   Albumin 3.7 3.5 - 5.0 g/dL   AST 21 15 - 41 U/L   ALT 15 14 - 54 U/L   Alkaline Phosphatase 48 38 - 126 U/L   Total Bilirubin 0.3 0.3 - 1.2 mg/dL   GFR calc non Af Amer >60 >60 mL/min   GFR calc Af Amer >60 >60 mL/min   Anion gap 8 5 - 15  CBC  Result Value Ref Range   WBC 9.7 3.6 - 11.0 K/uL   RBC 4.34 3.80 - 5.20 MIL/uL   Hemoglobin 13.6 12.0 - 16.0 g/dL   HCT 46.939.3 62.935.0 - 52.847.0 %   MCV 90.6 80.0 - 100.0 fL   MCH 31.3 26.0 - 34.0 pg   MCHC 34.6 32.0 - 36.0 g/dL   RDW 41.314.2 24.411.5 - 01.014.5 %   Platelets 173 150 - 440 K/uL  Urinalysis, Complete w Microscopic  Result Value Ref Range   Color, Urine YELLOW (A) YELLOW   APPearance CLEAR (A) CLEAR   Specific Gravity, Urine 1.005 1.005 - 1.030   pH 6.0 5.0 - 8.0   Glucose, UA NEGATIVE NEGATIVE mg/dL   Hgb urine dipstick MODERATE (A) NEGATIVE   Bilirubin Urine NEGATIVE NEGATIVE   Ketones, ur NEGATIVE NEGATIVE mg/dL   Protein, ur NEGATIVE NEGATIVE mg/dL   Nitrite NEGATIVE NEGATIVE   Leukocytes, UA NEGATIVE NEGATIVE   RBC / HPF NONE SEEN 0 - 5 RBC/hpf   WBC, UA 0-5 0 - 5 WBC/hpf   Bacteria, UA NONE SEEN NONE SEEN   Squamous Epithelial / LPF 0-5 (A) NONE SEEN   Mucous PRESENT   Pregnancy, urine POC  Result Value Ref Range   Preg Test, Ur NEGATIVE NEGATIVE   [place] Transvaginal Non-ob  Result Date: 05/24/2017 CLINICAL DATA:  Initial evaluation for acute colicky severe bilateral pelvic pain for 2 days. EXAM: TRANSABDOMINAL AND TRANSVAGINAL  ULTRASOUND OF PELVIS DOPPLER ULTRASOUND OF OVARIES TECHNIQUE: Both transabdominal and transvaginal ultrasound examinations of the pelvis were performed. Transabdominal technique was performed for global imaging of the pelvis including uterus, ovaries, adnexal regions, and pelvic cul-de-sac. It was necessary to proceed with endovaginal exam following the transabdominal exam to visualize the uterus and ovaries. Color and duplex Doppler ultrasound was utilized to evaluate blood flow to the ovaries. COMPARISON:  None. FINDINGS: Uterus Measurements: 8.9 x 4.4 x 5.2 cm. No fibroids or other mass visualized. Endometrium Thickness: 8.8 mm.  No focal abnormality visualized. Right ovary Measurements: 3.1 x 2.2 x 2.1 cm. Normal appearance/no adnexal mass. Left ovary Measurements: 2.2 x 2.1 x 1.9 cm. Normal appearance/no adnexal mass. Pulsed Doppler evaluation of both ovaries demonstrates normal low-resistance arterial and venous waveforms. Other findings No abnormal free fluid. IMPRESSION: Normal pelvic ultrasound. No acute abnormality identified. No evidence for torsion. Electronically Signed   By: Janell QuietBenjamin  McClintock M.D.  On: 05/24/2017 21:07   US Pelvis Complete  Result Date: 05/24/2017 CLINICAL DATA:  Initial evaluation for acute colicky severe bilateral pelvic pain for 2 days. EXAM: TRANSABDOMINAL AND TRANSVAGINAL ULTRASOUND OF PELVIS DOPPLER ULTRASOUND OF OVARIES TECHNIQUE: Both transabdominal and transvaginal ultrasound examinations of the pelvis were performed. Transabdominal technique was performed for global imaging of the pelvis including uterus, ovaries, adnexal regions, and pelvic cul-de-sac. It was necessary to proceed with endovaginal exam following the transabdominal exam to visualize the uterus and ovaries. Color and duplex Doppler ultrasound was utilized to evaluate blood flow to the ovaries. COMPARISON:  None. FINDINGS: Uterus Measurements: 8.9 x 4.4 x 5.2 cm. No fibroids or other mass visualized.  Endometrium Thickness: 8.8 mm.  No focal abnormality visualized. Right ovary Measurements: 3.1 x 2.2 x 2.1 cm. Normal appearance/no adnexal mass. Left ovary Measurements: 2.2 x 2.1 x 1.9 cm. Normal appearance/no adnexal mass. Pulsed Doppler evaluation of both ovaries demonstrates normal low-resistance arterial and venous waveforms. Other findings No abnormal free fluid. IMPRESSION: Normal pelvic ultrasound. No acute abnormality identified. No evidence for torsion. Electronically Signed   By: Rise Mu M.D.   On: 05/24/2017 21:07   [place] Art/ven Flow Abd Pelv Doppler  Result Date: 05/24/2017 CLINICAL DATA:  Initial evaluation for acute colicky severe bilateral pelvic pain for 2 days. EXAM: TRANSABDOMINAL AND TRANSVAGINAL ULTRASOUND OF PELVIS DOPPLER ULTRASOUND OF OVARIES TECHNIQUE: Both transabdominal and transvaginal ultrasound examinations of the pelvis were performed. Transabdominal technique was performed for global imaging of the pelvis including uterus, ovaries, adnexal regions, and pelvic cul-de-sac. It was necessary to proceed with endovaginal exam following the transabdominal exam to visualize the uterus and ovaries. Color and duplex Doppler ultrasound was utilized to evaluate blood flow to the ovaries. COMPARISON:  None. FINDINGS: Uterus Measurements: 8.9 x 4.4 x 5.2 cm. No fibroids or other mass visualized. Endometrium Thickness: 8.8 mm.  No focal abnormality visualized. Right ovary Measurements: 3.1 x 2.2 x 2.1 cm. Normal appearance/no adnexal mass. Left ovary Measurements: 2.2 x 2.1 x 1.9 cm. Normal appearance/no adnexal mass. Pulsed Doppler evaluation of both ovaries demonstrates normal low-resistance arterial and venous waveforms. Other findings No abnormal free fluid. IMPRESSION: Normal pelvic ultrasound. No acute abnormality identified. No evidence for torsion. Electronically Signed   By: Rise Mu M.D.   On: 05/24/2017 21:07

## 2017-05-24 NOTE — ED Provider Notes (Signed)
[facility] Emergency Department Provider Note  ____________________________________________  Time seen: Approximately 8:44 PM  I have reviewed the triage vital signs and the nursing notes.   HISTORY  Chief Complaint Vaginal Bleeding    HPI Lisa Booker is a 26 y.o. female who complains of severe cramping left lower quadrant pain for the past 3 days which is colicky, lasting an hour or 2 at a time and letting up for 20 minutes or so in between. Feels like it radiates into the hip bone. Notes that her period started yesterday, 2 days late, which is not normal for her. Denies unusual vaginal bleeding or discharge. She is sexually active with one partner and has a low suspicion of a sexual transmitted infection. Pain is worse with movement, no alleviating factors.     Past Medical History:  Diagnosis Date  . Migraine   . Ovarian cyst      There are no active problems to display for this patient.    Past Surgical History:  Procedure Laterality Date  . TUBAL LIGATION       Prior to Admission medications   Medication Sig Start Date End Date Taking? Authorizing Provider  butalbital-acetaminophen-caffeine (FIORICET, ESGIC) [phone] MG tablet Take 1-2 tablets by mouth every 6 (six) hours as needed for headache. 04/29/17 04/29/18  Emily Filbert, MD  dibucaine (NUPERCAINAL) 1 % ointment Apply topically 3 (three) times daily as needed for pain. 07/19/15   Governor Rooks, MD  fluconazole (DIFLUCAN) 150 MG tablet Take 1 tablet (150 mg total) by mouth daily. 05/17/15   Beers, Charmayne Sheer, PA-C  HYDROcodone-acetaminophen (NORCO/VICODIN) 5-325 MG per tablet Take 1 tablet by mouth every 6 (six) hours as needed for moderate pain. 07/19/15   Governor Rooks, MD  metroNIDAZOLE (FLAGYL) 500 MG tablet Take 1 tablet (500 mg total) by mouth 2 (two) times daily. 07/19/15   Governor Rooks, MD  naproxen (NAPROSYN) 500 MG tablet Take 1 tablet (500 mg total) by mouth 2 (two)  times daily with a meal. 05/24/17   Sharman Cheek, MD  phenazopyridine (PYRIDIUM) 200 MG tablet Take 1 tablet (200 mg total) by mouth 3 (three) times daily as needed for pain. 05/17/15   Beers, Charmayne Sheer, PA-C  sulfamethoxazole-trimethoprim (BACTRIM DS,SEPTRA DS) 800-160 MG per tablet Take 1 tablet by mouth 2 (two) times daily. 07/19/15   Governor Rooks, MD  traMADol (ULTRAM) 50 MG tablet Take 1 tablet (50 mg total) by mouth every 6 (six) hours as needed. 11/29/16   Jennye Moccasin, MD     Allergies Zofran Frazier Richards hcl]   History reviewed. No pertinent family history.  Social History Social History  Substance Use Topics  . Smoking status: Current Every Day Smoker    Packs/day: 0.50    Types: Cigarettes  . Smokeless tobacco: Never Used  . Alcohol use Yes    Review of Systems  Constitutional:   No fever or chills.  ENT:   No sore throat. No rhinorrhea. Cardiovascular:   No chest pain or syncope. Respiratory:   No dyspnea or cough. Gastrointestinal:  Positive for bilateral lower abdominopelvic pain as above, no vomiting or diarrhea.  Musculoskeletal:   Negative for focal pain or swelling All other systems reviewed and are negative except as documented above in ROS and HPI.  ____________________________________________   PHYSICAL EXAM:  VITAL SIGNS: ED Triage Vitals [05/24/17 1817]  Enc Vitals Group     BP (!) 96/57     Pulse Rate 65  Resp 16     Temp 99.1 F (37.3 C)     Temp Source Oral     SpO2 99 %     Weight 111 lb (50.3 kg)     Height 5\' 3"  (1.6 m)     Head Circumference      Peak Flow      Pain Score 7     Pain Loc      Pain Edu?      Excl. in GC?     Vital signs reviewed, nursing assessments reviewed.   Constitutional:   Alert and oriented. Well appearing and in no distress. Eyes:   No scleral icterus.  EOMI. No nystagmus. No conjunctival pallor. PERRL. ENT   Head:   Normocephalic and atraumatic.   Nose:   No congestion/rhinnorhea.     Mouth/Throat:   MMM, no pharyngeal erythema. No peritonsillar mass.    Neck:   No meningismus. Full ROM Hematological/Lymphatic/Immunilogical:   No cervical lymphadenopathy. Cardiovascular:   RRR. Symmetric bilateral radial and DP pulses.  No murmurs.  Respiratory:   Normal respiratory effort without tachypnea/retractions. Breath sounds are clear and equal bilaterally. No wheezes/rales/rhonchi. Gastrointestinal:   Soft With diffuse lower abdominal tenderness, especially in the suprapubic and para-suprapubic area.. Non distended. There is no CVA tenderness.  No rebound, rigidity, or guarding. Genitourinary:   deferred Musculoskeletal:   Normal range of motion in all extremities. No joint effusions.  No lower extremity tenderness.  No edema. Neurologic:   Normal speech and language.  Motor grossly intact. No gross focal neurologic deficits are appreciated.  Skin:    Skin is warm, dry and intact. No rash noted.  No petechiae, purpura, or bullae.  ____________________________________________    LABS (pertinent positives/negatives) (all labs ordered are listed, but only abnormal results are displayed) Labs Reviewed  COMPREHENSIVE METABOLIC PANEL - Abnormal; Notable for the following:       Result Value   Potassium 3.4 (*)    BUN 5 (*)    Total Protein 6.4 (*)    All other components within normal limits  URINALYSIS, COMPLETE (UACMP) WITH MICROSCOPIC - Abnormal; Notable for the following:    Color, Urine YELLOW (*)    APPearance CLEAR (*)    Hgb urine dipstick MODERATE (*)    Squamous Epithelial / LPF 0-5 (*)    All other components within normal limits  LIPASE, BLOOD  CBC  POC URINE PREG, ED  POCT PREGNANCY, URINE   ____________________________________________   EKG    ____________________________________________    RADIOLOGY  US Transvaginal Non-ob  Result Date: 05/24/2017 CLINICAL DATA:  Initial evaluation for acute colicky severe bilateral pelvic pain for 2  days. EXAM: TRANSABDOMINAL AND TRANSVAGINAL ULTRASOUND OF PELVIS DOPPLER ULTRASOUND OF OVARIES TECHNIQUE: Both transabdominal and transvaginal ultrasound examinations of the pelvis were performed. Transabdominal technique was performed for global imaging of the pelvis including uterus, ovaries, adnexal regions, and pelvic cul-de-sac. It was necessary to proceed with endovaginal exam following the transabdominal exam to visualize the uterus and ovaries. Color and duplex Doppler ultrasound was utilized to evaluate blood flow to the ovaries. COMPARISON:  None. FINDINGS: Uterus Measurements: 8.9 x 4.4 x 5.2 cm. No fibroids or other mass visualized. Endometrium Thickness: 8.8 mm.  No focal abnormality visualized. Right ovary Measurements: 3.1 x 2.2 x 2.1 cm. Normal appearance/no adnexal mass. Left ovary Measurements: 2.2 x 2.1 x 1.9 cm. Normal appearance/no adnexal mass. Pulsed Doppler evaluation of both ovaries demonstrates normal low-resistance arterial and venous waveforms.  Other findings No abnormal free fluid. IMPRESSION: Normal pelvic ultrasound. No acute abnormality identified. No evidence for torsion. Electronically Signed   By: Rise MuBenjamin  McClintock M.D.   On: 05/24/2017 21:07   [place] Pelvis Complete  Result Date: 05/24/2017 CLINICAL DATA:  Initial evaluation for acute colicky severe bilateral pelvic pain for 2 days. EXAM: TRANSABDOMINAL AND TRANSVAGINAL ULTRASOUND OF PELVIS DOPPLER ULTRASOUND OF OVARIES TECHNIQUE: Both transabdominal and transvaginal ultrasound examinations of the pelvis were performed. Transabdominal technique was performed for global imaging of the pelvis including uterus, ovaries, adnexal regions, and pelvic cul-de-sac. It was necessary to proceed with endovaginal exam following the transabdominal exam to visualize the uterus and ovaries. Color and duplex Doppler ultrasound was utilized to evaluate blood flow to the ovaries. COMPARISON:  None. FINDINGS: Uterus Measurements: 8.9 x 4.4 x 5.2  cm. No fibroids or other mass visualized. Endometrium Thickness: 8.8 mm.  No focal abnormality visualized. Right ovary Measurements: 3.1 x 2.2 x 2.1 cm. Normal appearance/no adnexal mass. Left ovary Measurements: 2.2 x 2.1 x 1.9 cm. Normal appearance/no adnexal mass. Pulsed Doppler evaluation of both ovaries demonstrates normal low-resistance arterial and venous waveforms. Other findings No abnormal free fluid. IMPRESSION: Normal pelvic ultrasound. No acute abnormality identified. No evidence for torsion. Electronically Signed   By: Rise MuBenjamin  McClintock M.D.   On: 05/24/2017 21:07   [place] Art/ven Flow Abd Pelv Doppler  Result Date: 05/24/2017 CLINICAL DATA:  Initial evaluation for acute colicky severe bilateral pelvic pain for 2 days. EXAM: TRANSABDOMINAL AND TRANSVAGINAL ULTRASOUND OF PELVIS DOPPLER ULTRASOUND OF OVARIES TECHNIQUE: Both transabdominal and transvaginal ultrasound examinations of the pelvis were performed. Transabdominal technique was performed for global imaging of the pelvis including uterus, ovaries, adnexal regions, and pelvic cul-de-sac. It was necessary to proceed with endovaginal exam following the transabdominal exam to visualize the uterus and ovaries. Color and duplex Doppler ultrasound was utilized to evaluate blood flow to the ovaries. COMPARISON:  None. FINDINGS: Uterus Measurements: 8.9 x 4.4 x 5.2 cm. No fibroids or other mass visualized. Endometrium Thickness: 8.8 mm.  No focal abnormality visualized. Right ovary Measurements: 3.1 x 2.2 x 2.1 cm. Normal appearance/no adnexal mass. Left ovary Measurements: 2.2 x 2.1 x 1.9 cm. Normal appearance/no adnexal mass. Pulsed Doppler evaluation of both ovaries demonstrates normal low-resistance arterial and venous waveforms. Other findings No abnormal free fluid. IMPRESSION: Normal pelvic ultrasound. No acute abnormality identified. No evidence for torsion. Electronically Signed   By: Rise MuBenjamin  McClintock M.D.   On: 05/24/2017 21:07     ____________________________________________   PROCEDURES Procedures  ____________________________________________   INITIAL IMPRESSION / ASSESSMENT AND PLAN / ED COURSE  Pertinent labs & imaging results that were available during my care of the patient were reviewed by me and considered in my medical decision making (see chart for details).  Patient uncomfortable but not in distress, vital signs unremarkable, presents with colicky pelvic pain which is not lateralizing but is concerning for torsion or ovarian cyst. Low suspicion of STI, would not use empiric antibiotics, patient declines initial pelvic exam. We will obtain a pelvic ultrasound, Dilaudid for pain      ----------------------------------------- 9:27 PM on 05/24/2017 -----------------------------------------  Feels much better. Vitals remain normal. Ultrasound negative. Again offered pelvic examination, patient refuses requests work note and will follow-up with her doctor if symptoms do not improve. Since ultrasound negative, she feels this is just bad menstrual cramps and is willing to monitor her symptoms at home.  ____________________________________________   FINAL CLINICAL IMPRESSION(S) / ED DIAGNOSES  Final  diagnoses:  Vaginal bleeding  Pelvic cramping      New Prescriptions   NAPROXEN (NAPROSYN) 500 MG TABLET    Take 1 tablet (500 mg total) by mouth 2 (two) times daily with a meal.     Portions of this note were generated with dragon dictation software. Dictation errors may occur despite best attempts at proofreading.    Sharman Cheek, MD 05/24/17 2128

## 2017-07-17 ENCOUNTER — Emergency Department
Admission: EM | Admit: 2017-07-17 | Discharge: 2017-07-17 | Disposition: A | Discharge status: Home / Self Care | Payer: Self-pay | Attending: Emergency Medicine | Admitting: Emergency Medicine

## 2017-07-17 DIAGNOSIS — F17210 Nicotine dependence, cigarettes, uncomplicated: Secondary | ICD-10-CM | POA: Insufficient documentation

## 2017-07-17 DIAGNOSIS — Z79899 Other long term (current) drug therapy: Secondary | ICD-10-CM | POA: Insufficient documentation

## 2017-07-17 DIAGNOSIS — G43809 Other migraine, not intractable, without status migrainosus: Secondary | ICD-10-CM | POA: Insufficient documentation

## 2017-07-17 MED ORDER — PROCHLORPERAZINE EDISYLATE 5 MG/ML IJ SOLN
10.0000 mg | Freq: Once | INTRAMUSCULAR | Status: AC
  Administered 2017-07-17: 10 mg via INTRAVENOUS
  Filled 2017-07-17: qty 2

## 2017-07-17 MED ORDER — KETOROLAC TROMETHAMINE 30 MG/ML IJ SOLN
10.0000 mg | Freq: Once | INTRAMUSCULAR | Status: AC
  Administered 2017-07-17: 9.9 mg via INTRAVENOUS
  Filled 2017-07-17: qty 1

## 2017-07-17 MED ORDER — DIPHENHYDRAMINE HCL 50 MG/ML IJ SOLN
25.0000 mg | Freq: Once | INTRAMUSCULAR | Status: AC
  Administered 2017-07-17: 25 mg via INTRAVENOUS
  Filled 2017-07-17: qty 1

## 2017-07-17 MED ORDER — SODIUM CHLORIDE 0.9 % IV BOLUS (SEPSIS)
1000.0000 mL | Freq: Once | INTRAVENOUS | Status: AC
  Administered 2017-07-17: 1000 mL via INTRAVENOUS

## 2017-07-17 NOTE — ED Notes (Signed)
Upon discharge, room found to be empty, PIV laying in bed still hooked to IV fluids.  Unable to obtain discharge signature or update vitals due to this reason.

## 2017-07-17 NOTE — ED Notes (Signed)
Pt ambulatory to bathroom without difficulty.  

## 2017-07-17 NOTE — ED Triage Notes (Signed)
Migraine that started yesterday. Nausea. Photosensitivity. Tylenol last taken approx 6 hours ago. Hx of migraines similar.

## 2017-07-17 NOTE — ED Notes (Signed)
FN: pt presents with migraine that is making her nauseas.

## 2017-07-17 NOTE — ED Provider Notes (Signed)
[facility] Emergency Department Provider Note  ____________________________________________   First MD Initiated Contact with Patient 07/17/17 1528     (approximate)  I have reviewed the triage vital signs and the nursing notes.   HISTORY  Chief Complaint Migraine    HPI Lisa Booker is a 26 y.o. female who comes to the emergency department with roughly 24 hours of gradual onset not maximal onset bifrontal throbbing aching headache identical to previous headaches in the past. The pain begins in bilateral temples and radiates back towards her occiput. It is associated with nausea but no vomiting. It is associated with photosensitivity. No fevers or chills. No neck pain. She has taken Tylenol at home with no relief.   Past Medical History:  Diagnosis Date  . Migraine   . Ovarian cyst     There are no active problems to display for this patient.   Past Surgical History:  Procedure Laterality Date  . TUBAL LIGATION      Prior to Admission medications   Medication Sig Start Date End Date Taking? Authorizing Provider  butalbital-acetaminophen-caffeine (FIORICET, ESGIC) [phone] MG tablet Take 1-2 tablets by mouth every 6 (six) hours as needed for headache. 04/29/17 04/29/18  Emily FilbertWilliams, Jonathan E, MD  dibucaine (NUPERCAINAL) 1 % ointment Apply topically 3 (three) times daily as needed for pain. 07/19/15   Governor RooksLord, Rebecca, MD  fluconazole (DIFLUCAN) 150 MG tablet Take 1 tablet (150 mg total) by mouth daily. 05/17/15   Beers, Charmayne Sheerharles M, PA-C  HYDROcodone-acetaminophen (NORCO/VICODIN) 5-325 MG per tablet Take 1 tablet by mouth every 6 (six) hours as needed for moderate pain. 07/19/15   Governor RooksLord, Rebecca, MD  metroNIDAZOLE (FLAGYL) 500 MG tablet Take 1 tablet (500 mg total) by mouth 2 (two) times daily. 07/19/15   Governor RooksLord, Rebecca, MD  naproxen (NAPROSYN) 500 MG tablet Take 1 tablet (500 mg total) by mouth 2 (two) times daily with a meal. 05/24/17   Sharman CheekStafford, Phillip, MD   phenazopyridine (PYRIDIUM) 200 MG tablet Take 1 tablet (200 mg total) by mouth 3 (three) times daily as needed for pain. 05/17/15   Beers, Charmayne Sheerharles M, PA-C  sulfamethoxazole-trimethoprim (BACTRIM DS,SEPTRA DS) 800-160 MG per tablet Take 1 tablet by mouth 2 (two) times daily. 07/19/15   Governor RooksLord, Rebecca, MD  traMADol (ULTRAM) 50 MG tablet Take 1 tablet (50 mg total) by mouth every 6 (six) hours as needed. 11/29/16   Jennye MoccasinQuigley, Brian S, MD    Allergies Zofran Frazier Richards[ondansetron hcl]  No family history on file.  Social History Social History  Substance Use Topics  . Smoking status: Current Every Day Smoker    Packs/day: 0.50    Types: Cigarettes  . Smokeless tobacco: Never Used  . Alcohol use Yes    Review of Systems Constitutional: No fever/chills Eyes: No visual changes. ENT: No sore throat. Cardiovascular: Denies chest pain. Respiratory: Denies shortness of breath. Gastrointestinal: No abdominal pain.  No nausea, no vomiting.  No diarrhea.  No constipation. Genitourinary: Negative for dysuria. Musculoskeletal: Negative for back pain. Skin: Negative for rash. Neurological: positive for headache   ____________________________________________   PHYSICAL EXAM:  VITAL SIGNS: ED Triage Vitals [07/17/17 1446]  Enc Vitals Group     BP 120/79     Pulse Rate 66     Resp 18     Temp 99.1 F (37.3 C)     Temp Source Oral     SpO2 100 %     Weight 112 lb (50.8 kg)  Height  (1.6 m)     Head Circumference      Peak Flow      Pain Score 9     Pain Loc      Pain Edu?      Excl. in GC?     Constitutional: alert and oriented 4 appears extremely uncomfortable tearful shielding her eyes and the dark Eyes: PERRL EOMI.pupils are 7 mm to 5 mm bilateral and brisk Head: Atraumatic. Nose: No congestion/rhinnorhea. Mouth/Throat: No trismus Neck: No stridor.   Cardiovascular: Normal rate, regular rhythm. Grossly normal heart sounds.  Good peripheral circulation. Respiratory: Normal  respiratory effort.  No retractions. Lungs CTAB and moving good air Gastrointestinal: soft nontender Musculoskeletal: No lower extremity edema   Neurologic:  Normal speech and language. No gross focal neurologic deficits are appreciated. Skin:  Skin is warm, dry and intact. No rash noted. Psychiatric: Mood and affect are normal. Speech and behavior are normal.    ____________________________________________   DIFFERENTIAL includes but not limited to  migraine headache, tension headache, temporal arteritis, intracerebral hemorrhage_________________________________________   LABS (all labs ordered are listed, but only abnormal results are displayed)  Labs Reviewed - No data to display   __________________________________________  EKG   ____________________________________________  RADIOLOGY   ____________________________________________   PROCEDURES  Procedure(s) performed: no  Procedures  Critical Care performed: no  Observation: no ____________________________________________   INITIAL IMPRESSION / ASSESSMENT AND PLAN / ED COURSE  Pertinent labs & imaging results that were available during my care of the patient were reviewed by me and considered in my medical decision making (see chart for details).  The patient appears miserable although is hemodynamically stable with a normal neurological exam. Bilateral symptoms are less consistent with migraine, however will treat her with Compazine, Benadryl, Toradol, fluids now and reevaluate    The patient's headache is markedly improved and she remains neurologically intact. She is stable for outpatient management.  ____________________________________________   FINAL CLINICAL IMPRESSION(S) / ED DIAGNOSES  Final diagnoses:  Other migraine without status migrainosus, not intractable      NEW MEDICATIONS STARTED DURING THIS VISIT:  Discharge Medication List as of 07/17/2017  5:43 PM       Note:  This  document was prepared using Dragon voice recognition software and may include unintentional dictation errors.     Merrily Brittle, MD 07/17/17 1743

## 2017-12-26 ENCOUNTER — Encounter: Payer: Self-pay | Admitting: Emergency Medicine

## 2017-12-26 ENCOUNTER — Emergency Department
Admission: EM | Admit: 2017-12-26 | Discharge: 2017-12-26 | Disposition: A | Discharge status: Home / Self Care | Payer: Self-pay | Attending: Emergency Medicine | Admitting: Emergency Medicine

## 2017-12-26 DIAGNOSIS — J111 Influenza due to unidentified influenza virus with other respiratory manifestations: Secondary | ICD-10-CM | POA: Insufficient documentation

## 2017-12-26 DIAGNOSIS — F17210 Nicotine dependence, cigarettes, uncomplicated: Secondary | ICD-10-CM | POA: Insufficient documentation

## 2017-12-26 DIAGNOSIS — R69 Illness, unspecified: Secondary | ICD-10-CM

## 2017-12-26 LAB — INFLUENZA PANEL BY PCR (TYPE A & B)
INFLBPCR: NEGATIVE
Influenza A By PCR: NEGATIVE

## 2017-12-26 MED ORDER — IBUPROFEN 600 MG PO TABS
600.0000 mg | ORAL_TABLET | Freq: Once | ORAL | Status: AC
  Administered 2017-12-26: 600 mg via ORAL
  Filled 2017-12-26: qty 1

## 2017-12-26 MED ORDER — IBUPROFEN 600 MG PO TABS: 600.0000 mg | ORAL_TABLET | Freq: Three times a day (TID) | ORAL | 0 refills | Status: DC | PRN

## 2017-12-26 MED ORDER — PSEUDOEPH-BROMPHEN-DM 30-2-10 MG/5ML PO SYRP: 5.0000 mL | ORAL_SOLUTION | Freq: Four times a day (QID) | ORAL | 0 refills | Status: DC | PRN

## 2017-12-26 MED ORDER — BENZONATATE 100 MG PO CAPS
200.0000 mg | ORAL_CAPSULE | Freq: Once | ORAL | Status: AC
  Administered 2017-12-26: 200 mg via ORAL
  Filled 2017-12-26: qty 2

## 2017-12-26 NOTE — ED Triage Notes (Signed)
Pt with flu like sx since Sunday. Has taken otc cough meds with no relief.

## 2017-12-26 NOTE — ED Provider Notes (Signed)
[facility] Emergency Department Provider Note   ____________________________________________   First MD Initiated Contact with Patient 12/26/17 1207     (approximate)  I have reviewed the triage vital signs and the nursing notes.   HISTORY  Chief Complaint Generalized Body Aches    HPI Lisa Booker is a 27 y.o. female patient complained of flulike symptoms for 2 days.  Patient states generalized body aches, nasal congestion, sore throat, and cough.  Patient denies nausea, vomiting, diarrhea.  Patient rates her pain as a 6/10.  Patient described the pain is "achy".  Patient has not taken a flu shot this season.  No palliative measures for complaint.   Past Medical History:  Diagnosis Date  . Migraine   . Ovarian cyst     There are no active problems to display for this patient.   Past Surgical History:  Procedure Laterality Date  . TUBAL LIGATION      Prior to Admission medications   Medication Sig Start Date End Date Taking? Authorizing Provider  brompheniramine-pseudoephedrine-DM 30-2-10 MG/5ML syrup Take 5 mLs by mouth 4 (four) times daily as needed. 12/26/17   Joni Reining, PA-C  butalbital-acetaminophen-caffeine (FIORICET, ESGIC) [phone] MG tablet Take 1-2 tablets by mouth every 6 (six) hours as needed for headache. 04/29/17 04/29/18  Emily Filbert, MD  dibucaine (NUPERCAINAL) 1 % ointment Apply topically 3 (three) times daily as needed for pain. 07/19/15   Governor Rooks, MD  fluconazole (DIFLUCAN) 150 MG tablet Take 1 tablet (150 mg total) by mouth daily. 05/17/15   Beers, Charmayne Sheer, PA-C  HYDROcodone-acetaminophen (NORCO/VICODIN) 5-325 MG per tablet Take 1 tablet by mouth every 6 (six) hours as needed for moderate pain. 07/19/15   Governor Rooks, MD  ibuprofen (ADVIL,MOTRIN) 600 MG tablet Take 1 tablet (600 mg total) by mouth every 8 (eight) hours as needed. 12/26/17   Joni Reining, PA-C  metroNIDAZOLE (FLAGYL) 500 MG tablet Take 1  tablet (500 mg total) by mouth 2 (two) times daily. 07/19/15   Governor Rooks, MD  naproxen (NAPROSYN) 500 MG tablet Take 1 tablet (500 mg total) by mouth 2 (two) times daily with a meal. 05/24/17   Sharman Cheek, MD  phenazopyridine (PYRIDIUM) 200 MG tablet Take 1 tablet (200 mg total) by mouth 3 (three) times daily as needed for pain. 05/17/15   Beers, Charmayne Sheer, PA-C  sulfamethoxazole-trimethoprim (BACTRIM DS,SEPTRA DS) 800-160 MG per tablet Take 1 tablet by mouth 2 (two) times daily. 07/19/15   Governor Rooks, MD  traMADol (ULTRAM) 50 MG tablet Take 1 tablet (50 mg total) by mouth every 6 (six) hours as needed. 11/29/16   Jennye Moccasin, MD    Allergies Zofran Frazier Richards hcl]  No family history on file.  Social History Social History   Tobacco Use  . Smoking status: Current Every Day Smoker    Packs/day: 0.50    Types: Cigarettes  . Smokeless tobacco: Never Used  Substance Use Topics  . Alcohol use: Yes  . Drug use: No    Review of Systems Constitutional: No fever/chills.  Body aches Eyes: No visual changes. ENT: Nasal congestion and sore throat Cardiovascular: Denies chest pain. Respiratory: Denies shortness of breath.  Nonproductive cough Gastrointestinal: No abdominal pain.  No nausea, no vomiting.  No diarrhea.  No constipation. Genitourinary: Negative for dysuria. Musculoskeletal: Negative for back pain. Skin: Negative for rash. Neurological: Negative for headaches, focal weakness or numbness. Allergic/Immunilogical: Zofran ____________________________________________   PHYSICAL EXAM:  VITAL SIGNS:  ED Triage Vitals  Enc Vitals Group     BP 12/26/17 1147 107/67     Pulse Rate 12/26/17 1147 64     Resp 12/26/17 1147 20     Temp 12/26/17 1147 98.5 F (36.9 C)     Temp Source 12/26/17 1147 Oral     SpO2 12/26/17 1147 98 %     Weight 12/26/17 1148 112 lb (50.8 kg)     Height --      Head Circumference --      Peak Flow --      Pain Score 12/26/17 1148 5      Pain Loc --      Pain Edu? --      Excl. in GC? --    Constitutional: Alert and oriented. Well appearing and in no acute distress. Nose: Edematous nasal turbinates clear rhinorrhea Mouth/Throat: Mucous membranes are moist.  Oropharynx non-erythematous.  Postnasal drainage Neck: No stridor.  Cardiovascular: Normal rate, regular rhythm. Grossly normal heart sounds.  Good peripheral circulation. Respiratory: Normal respiratory effort.  No retractions. Lungs CTAB. Neurologic:  Normal speech and language. No gross focal neurologic deficits are appreciated. No gait instability. Skin:  Skin is warm, dry and intact. No rash noted. Psychiatric: Mood and affect are normal. Speech and behavior are normal.  ____________________________________________   LABS (all labs ordered are listed, but only abnormal results are displayed)  Labs Reviewed  INFLUENZA PANEL BY PCR (TYPE A & B)   ____________________________________________  EKG   ____________________________________________  RADIOLOGY  ED MD interpretation:    Official radiology report(s): No results found.  ____________________________________________   PROCEDURES  Procedure(s) performed: None  Procedures  Critical Care performed: No  ____________________________________________   INITIAL IMPRESSION / ASSESSMENT AND PLAN / ED COURSE  As part of my medical decision making, I reviewed the following data within the electronic MEDICAL RECORD NUMBER    Viral respiratory illness.  Discussed negative flu results with patient.  Patient given discharge care instruction work note.  Patient advised [facility] persist.      ____________________________________________   FINAL CLINICAL IMPRESSION(S) / ED DIAGNOSES  Final diagnoses:  Influenza-like illness     ED Discharge Orders        Ordered    brompheniramine-pseudoephedrine-DM 30-2-10 MG/5ML syrup  4 times daily PRN     12/26/17  1324    ibuprofen (ADVIL,MOTRIN) 600 MG tablet  Every 8 hours PRN     12/26/17 1324       Note:  This document was prepared using Dragon voice recognition software and may include unintentional dictation errors.    Joni ReiningSmith, Yulisa Chirico K, PA-C 12/26/17 1327    Emily FilbertWilliams, Jonathan E, MD 12/26/17 [phone]

## 2017-12-26 NOTE — ED Notes (Signed)
See triage note  States she developed some body aches with sore throat and slight cough about 3-4 days ago  Still having slight sore throat but now she is hoarse   Afebrile on arrival  Also having some pain to lateral rib areas from coughing

## 2018-08-05 ENCOUNTER — Emergency Department: Payer: No Typology Code available for payment source

## 2018-08-05 ENCOUNTER — Emergency Department
Admission: EM | Admit: 2018-08-05 | Discharge: 2018-08-05 | Disposition: A | Discharge status: Home / Self Care | Payer: No Typology Code available for payment source | Attending: Emergency Medicine | Admitting: Emergency Medicine

## 2018-08-05 ENCOUNTER — Other Ambulatory Visit: Payer: Self-pay

## 2018-08-05 ENCOUNTER — Encounter: Payer: Self-pay | Admitting: Emergency Medicine

## 2018-08-05 DIAGNOSIS — Y939 Activity, unspecified: Secondary | ICD-10-CM | POA: Diagnosis not present

## 2018-08-05 DIAGNOSIS — Y92410 Unspecified street and highway as the place of occurrence of the external cause: Secondary | ICD-10-CM | POA: Diagnosis not present

## 2018-08-05 DIAGNOSIS — S0990XA Unspecified injury of head, initial encounter: Secondary | ICD-10-CM | POA: Insufficient documentation

## 2018-08-05 DIAGNOSIS — Y998 Other external cause status: Secondary | ICD-10-CM | POA: Diagnosis not present

## 2018-08-05 DIAGNOSIS — F17210 Nicotine dependence, cigarettes, uncomplicated: Secondary | ICD-10-CM | POA: Insufficient documentation

## 2018-08-05 DIAGNOSIS — Z79899 Other long term (current) drug therapy: Secondary | ICD-10-CM | POA: Insufficient documentation

## 2018-08-05 DIAGNOSIS — M25551 Pain in right hip: Secondary | ICD-10-CM | POA: Insufficient documentation

## 2018-08-05 DIAGNOSIS — M545 Low back pain: Secondary | ICD-10-CM | POA: Diagnosis not present

## 2018-08-05 LAB — URINALYSIS, COMPLETE (UACMP) WITH MICROSCOPIC
Bacteria, UA: NONE SEEN
Bilirubin Urine: NEGATIVE
Glucose, UA: NEGATIVE mg/dL
Hgb urine dipstick: NEGATIVE
Ketones, ur: NEGATIVE mg/dL
Nitrite: NEGATIVE
Protein, ur: NEGATIVE mg/dL
Specific Gravity, Urine: 1 — ABNORMAL LOW (ref 1.005–1.030)
pH: 7 (ref 5.0–8.0)

## 2018-08-05 LAB — POCT PREGNANCY, URINE: Preg Test, Ur: NEGATIVE

## 2018-08-05 MED ORDER — KETOROLAC TROMETHAMINE 30 MG/ML IJ SOLN
30.0000 mg | Freq: Once | INTRAMUSCULAR | Status: AC
  Administered 2018-08-05: 30 mg via INTRAMUSCULAR
  Filled 2018-08-05: qty 1

## 2018-08-05 MED ORDER — MELOXICAM 15 MG PO TABS: 15.0000 mg | ORAL_TABLET | Freq: Every day | ORAL | 1 refills | Status: AC

## 2018-08-05 MED ORDER — HYDROCODONE-ACETAMINOPHEN 10-325 MG PO TABS: 1.0000 | ORAL_TABLET | Freq: Four times a day (QID) | ORAL | 0 refills | Status: AC | PRN

## 2018-08-05 MED ORDER — MORPHINE SULFATE (PF) 4 MG/ML IV SOLN
4.0000 mg | Freq: Once | INTRAVENOUS | Status: AC
  Administered 2018-08-05: 4 mg via INTRAMUSCULAR
  Filled 2018-08-05: qty 1

## 2018-08-05 MED ORDER — METHOCARBAMOL 500 MG PO TABS
1000.0000 mg | ORAL_TABLET | Freq: Once | ORAL | Status: AC
  Administered 2018-08-05: 1000 mg via ORAL
  Filled 2018-08-05: qty 2

## 2018-08-05 NOTE — ED Notes (Signed)
Patient transported to MRI 

## 2018-08-05 NOTE — ED Notes (Signed)
To Bathroom by  Wheelchair

## 2018-08-05 NOTE — ED Notes (Signed)
Pt reports   Pain r side of back  Going down to right foot pt is tearfull

## 2018-08-05 NOTE — ED Notes (Signed)
To bathroom by w/c  Voided  Mod amount
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
## 2018-08-05 NOTE — ED Notes (Signed)
Patient transported to CT 
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
## 2019-12-18 ENCOUNTER — Ambulatory Visit: Payer: Self-pay | Attending: Internal Medicine

## 2019-12-18 DIAGNOSIS — Z20822 Contact with and (suspected) exposure to covid-19: Secondary | ICD-10-CM

## 2019-12-19 LAB — NOVEL CORONAVIRUS, NAA: SARS-CoV-2, NAA: NOT DETECTED

## 2020-07-15 ENCOUNTER — Emergency Department: Payer: Self-pay

## 2020-07-15 ENCOUNTER — Other Ambulatory Visit: Payer: Self-pay

## 2020-07-15 ENCOUNTER — Encounter: Payer: Self-pay | Admitting: Emergency Medicine

## 2020-07-15 DIAGNOSIS — M549 Dorsalgia, unspecified: Secondary | ICD-10-CM | POA: Insufficient documentation

## 2020-07-15 DIAGNOSIS — R0789 Other chest pain: Secondary | ICD-10-CM | POA: Insufficient documentation

## 2020-07-15 DIAGNOSIS — Z5321 Procedure and treatment not carried out due to patient leaving prior to being seen by health care provider: Secondary | ICD-10-CM | POA: Insufficient documentation

## 2020-07-15 LAB — CBC
HCT: 39.8 % (ref 36.0–46.0)
Hemoglobin: 13.7 g/dL (ref 12.0–15.0)
MCH: 32.5 pg (ref 26.0–34.0)
MCHC: 34.4 g/dL (ref 30.0–36.0)
MCV: 94.5 fL (ref 80.0–100.0)
Platelets: 214 10*3/uL (ref 150–400)
RBC: 4.21 MIL/uL (ref 3.87–5.11)
RDW: 12.9 % (ref 11.5–15.5)
WBC: 12.1 10*3/uL — ABNORMAL HIGH (ref 4.0–10.5)
nRBC: 0 % (ref 0.0–0.2)

## 2020-07-15 LAB — BASIC METABOLIC PANEL
Anion gap: 8 (ref 5–15)
BUN: 6 mg/dL (ref 6–20)
CO2: 25 mmol/L (ref 22–32)
Calcium: 9.3 mg/dL (ref 8.9–10.3)
Chloride: 106 mmol/L (ref 98–111)
Creatinine, Ser: 0.81 mg/dL (ref 0.44–1.00)
GFR calc Af Amer: >60 mL/min (ref >60)
GFR calc non Af Amer: >60 mL/min (ref >60)
Glucose, Bld: 106 mg/dL — ABNORMAL HIGH (ref 70–99)
Potassium: 3.5 mmol/L (ref 3.5–5.1)
Sodium: 139 mmol/L (ref 135–145)

## 2020-07-15 LAB — TROPONIN I (HIGH SENSITIVITY)
Troponin I (High Sensitivity): 2 ng/L (ref <18)
Troponin I (High Sensitivity): 3 ng/L (ref <18)

## 2020-07-15 NOTE — ED Triage Notes (Signed)
Pt to ED from home c/o mid chest pain radiating to left side and back that is sharp and stabbing that started suddenly around 1600-1630 today while getting ready for work, denies injury or trauma, states hard to take a deep breath, denies cough fever chills or n/v/d.  Pt tearful in triage, chest rise even and unlabored, in NAD at this time.

## 2020-07-16 ENCOUNTER — Emergency Department
Admission: EM | Admit: 2020-07-16 | Discharge: 2020-07-16 | Disposition: A | Discharge status: Home / Self Care | Payer: Self-pay | Attending: Emergency Medicine | Admitting: Emergency Medicine

## 2020-07-16 NOTE — ED Notes (Signed)
No answer when called several times from lobby 

## 2021-02-17 ENCOUNTER — Emergency Department
Admission: EM | Admit: 2021-02-17 | Discharge: 2021-02-17 | Disposition: A | Discharge status: Home / Self Care | Payer: Self-pay | Attending: Emergency Medicine | Admitting: Emergency Medicine

## 2021-02-17 ENCOUNTER — Emergency Department: Payer: Self-pay

## 2021-02-17 ENCOUNTER — Encounter: Payer: Self-pay | Admitting: Emergency Medicine

## 2021-02-17 DIAGNOSIS — J40 Bronchitis, not specified as acute or chronic: Secondary | ICD-10-CM

## 2021-02-17 DIAGNOSIS — J209 Acute bronchitis, unspecified: Secondary | ICD-10-CM | POA: Insufficient documentation

## 2021-02-17 DIAGNOSIS — Z20822 Contact with and (suspected) exposure to covid-19: Secondary | ICD-10-CM | POA: Insufficient documentation

## 2021-02-17 DIAGNOSIS — F17210 Nicotine dependence, cigarettes, uncomplicated: Secondary | ICD-10-CM | POA: Insufficient documentation

## 2021-02-17 LAB — URINALYSIS, COMPLETE (UACMP) WITH MICROSCOPIC
Bacteria, UA: NONE SEEN
Bilirubin Urine: NEGATIVE
Glucose, UA: NEGATIVE mg/dL
Hgb urine dipstick: NEGATIVE
Ketones, ur: NEGATIVE mg/dL
Leukocytes,Ua: NEGATIVE
Nitrite: NEGATIVE
Protein, ur: NEGATIVE mg/dL
Specific Gravity, Urine: 1.004 — ABNORMAL LOW (ref 1.005–1.030)
pH: 6 (ref 5.0–8.0)

## 2021-02-17 LAB — RESP PANEL BY RT-PCR (FLU A&B, COVID) ARPGX2
Influenza A by PCR: NEGATIVE
Influenza B by PCR: NEGATIVE
SARS Coronavirus 2 by RT PCR: NEGATIVE

## 2021-02-17 LAB — D-DIMER, QUANTITATIVE: D-Dimer, Quant: 0.39 ug/mL-FEU (ref 0.00–0.50)

## 2021-02-17 LAB — CBC WITH DIFFERENTIAL/PLATELET
Abs Immature Granulocytes: 0.05 10*3/uL (ref 0.00–0.07)
Basophils Absolute: 0.1 10*3/uL (ref 0.0–0.1)
Basophils Relative: 1 %
Eosinophils Absolute: 0.3 10*3/uL (ref 0.0–0.5)
Eosinophils Relative: 2 %
HCT: 42.7 % (ref 36.0–46.0)
Hemoglobin: 15.2 g/dL — ABNORMAL HIGH (ref 12.0–15.0)
Immature Granulocytes: 0 %
Lymphocytes Relative: 30 %
Lymphs Abs: 3.3 10*3/uL (ref 0.7–4.0)
MCH: 32.7 pg (ref 26.0–34.0)
MCHC: 35.6 g/dL (ref 30.0–36.0)
MCV: 91.8 fL (ref 80.0–100.0)
Monocytes Absolute: 0.7 10*3/uL (ref 0.1–1.0)
Monocytes Relative: 6 %
Neutro Abs: 6.8 10*3/uL (ref 1.7–7.7)
Neutrophils Relative %: 61 %
Platelets: 245 10*3/uL (ref 150–400)
RBC: 4.65 MIL/uL (ref 3.87–5.11)
RDW: 13.4 % (ref 11.5–15.5)
WBC: 11.2 10*3/uL — ABNORMAL HIGH (ref 4.0–10.5)
nRBC: 0 % (ref 0.0–0.2)

## 2021-02-17 LAB — COMPREHENSIVE METABOLIC PANEL
ALT: 17 U/L (ref 0–44)
AST: 23 U/L (ref 15–41)
Albumin: 4.2 g/dL (ref 3.5–5.0)
Alkaline Phosphatase: 70 U/L (ref 38–126)
Anion gap: 10 (ref 5–15)
BUN: 7 mg/dL (ref 6–20)
CO2: 18 mmol/L — ABNORMAL LOW (ref 22–32)
Calcium: 9.1 mg/dL (ref 8.9–10.3)
Chloride: 110 mmol/L (ref 98–111)
Creatinine, Ser: 0.86 mg/dL (ref 0.44–1.00)
GFR, Estimated: >60 mL/min (ref >60)
Glucose, Bld: 106 mg/dL — ABNORMAL HIGH (ref 70–99)
Potassium: 4.1 mmol/L (ref 3.5–5.1)
Sodium: 138 mmol/L (ref 135–145)
Total Bilirubin: 0.6 mg/dL (ref 0.3–1.2)
Total Protein: 7.5 g/dL (ref 6.5–8.1)

## 2021-02-17 LAB — POC URINE PREG, ED: Preg Test, Ur: NEGATIVE

## 2021-02-17 LAB — PREGNANCY, URINE: Preg Test, Ur: NEGATIVE

## 2021-02-17 LAB — TROPONIN I (HIGH SENSITIVITY): Troponin I (High Sensitivity): 3 ng/L (ref <18)

## 2021-02-17 LAB — PROCALCITONIN: Procalcitonin: <0.1 ng/mL

## 2021-02-17 MED ORDER — BENZONATATE 100 MG PO CAPS: 100.0000 mg | ORAL_CAPSULE | Freq: Three times a day (TID) | ORAL | 0 refills | Status: AC | PRN

## 2021-02-17 MED ORDER — ACETAMINOPHEN 500 MG PO TABS
1000.0000 mg | ORAL_TABLET | Freq: Once | ORAL | Status: AC
  Administered 2021-02-17: 1000 mg via ORAL
  Filled 2021-02-17: qty 2

## 2021-02-17 MED ORDER — KETOROLAC TROMETHAMINE 30 MG/ML IJ SOLN
15.0000 mg | Freq: Once | INTRAMUSCULAR | Status: AC
  Administered 2021-02-17: 15 mg via INTRAVENOUS
  Filled 2021-02-17: qty 1

## 2021-02-17 NOTE — ED Notes (Signed)
Pt requesting to speak to MD regarding blood work. MD Fuller Plan notified and states she will be in to update pt after all labs resulted.  Pt informed of this, and stated that the board states lab work states results take 90 mins, pt informed labs are not resulted at this time and MD will be in to discuss results when available

## 2021-02-17 NOTE — ED Triage Notes (Signed)
Pt c/o SOB and cough x1 week. 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

## 2021-02-17 NOTE — ED Provider Notes (Signed)
8:20 AM Assumed care for off going team.   Blood pressure (!) 105/57, pulse (!) 53, temperature 98.7 F (37.1 C), temperature source Oral, resp. rate 15, SpO2 98 %.  See their HPI for full report but in brief most likely discharge home if lab work is reassuring.  Lab work is reassuring.  Slight low bicarb but no evidence of DKA, kidney disease, no aspirin use  Most likely secondary to a little bit of hyper ventilation given patient is splinting due to pain.  She reports having a lot of coughing does causing  some right-sided chest pain. Pt tender on exam. Most likely MSK given re-assuring workup.  Her lungs are clear bilaterally.  Given reassuring work-up will discharge patient home. Dr. Katrinka Blazing wrote prescription for cough medicine.  I discussed the provisional nature of ED diagnosis, the treatment so far, the ongoing plan of care, follow up appointments and return precautions with the patient and any family or support people present. They expressed understanding and agreed with the plan, discharged home.           Concha Se, MD 02/17/21 [phone]
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
## 2021-02-17 NOTE — ED Notes (Signed)
Lab contacted to check on lab results

## 2021-02-17 NOTE — Discharge Instructions (Addendum)
You also try some Allegra or Zyrtec during the day in case this could be allergies.  Take Tylenol 1 g every 8 hours and ibuprofen 600 every 6 hours with food to help with your discomfort.

## 2021-07-26 ENCOUNTER — Emergency Department
Admission: EM | Admit: 2021-07-26 | Discharge: 2021-07-26 | Disposition: A | Discharge status: Home / Self Care | Payer: Commercial Managed Care - PPO | Attending: Emergency Medicine | Admitting: Emergency Medicine

## 2021-07-26 ENCOUNTER — Other Ambulatory Visit: Payer: Self-pay

## 2021-07-26 DIAGNOSIS — U071 COVID-19: Secondary | ICD-10-CM | POA: Insufficient documentation

## 2021-07-26 DIAGNOSIS — M791 Myalgia, unspecified site: Secondary | ICD-10-CM | POA: Diagnosis present

## 2021-07-26 DIAGNOSIS — F17210 Nicotine dependence, cigarettes, uncomplicated: Secondary | ICD-10-CM | POA: Insufficient documentation

## 2021-07-26 DIAGNOSIS — Z20822 Contact with and (suspected) exposure to covid-19: Secondary | ICD-10-CM

## 2021-07-26 LAB — RESP PANEL BY RT-PCR (FLU A&B, COVID) ARPGX2
Influenza A by PCR: NEGATIVE
Influenza B by PCR: NEGATIVE
SARS Coronavirus 2 by RT PCR: POSITIVE — AB

## 2021-07-26 MED ORDER — METOCLOPRAMIDE HCL 10 MG PO TABS
10.0000 mg | ORAL_TABLET | Freq: Once | ORAL | Status: AC
  Administered 2021-07-26: 10 mg via ORAL
  Filled 2021-07-26: qty 1

## 2021-07-26 MED ORDER — NIRMATRELVIR/RITONAVIR (PAXLOVID)TABLET: 3.0000 | ORAL_TABLET | Freq: Two times a day (BID) | ORAL | 0 refills | Status: AC

## 2021-07-26 MED ORDER — PROMETHAZINE HCL 25 MG RE SUPP: 25.0000 mg | Freq: Four times a day (QID) | RECTAL | 0 refills | Status: DC | PRN

## 2021-07-26 NOTE — Discharge Instructions (Signed)
Follow-up with your regular doctor if not improving to 3 days.  Return the emergency department worsening.  Drink plenty of fluids.  We gave you prescription for Phenergan as needed for nausea/vomiting.  If COVID test is positive he should remain out of work for 5 days.

## 2021-07-26 NOTE — ED Provider Notes (Addendum)
[facility] Emergency Department Provider Note  ____________________________________________   Event Date/Time   First MD Initiated Contact with Patient 07/26/21 [phone]     (approximate)  I have reviewed the triage vital signs and the nursing notes.   HISTORY  Chief Complaint covid sx    HPI Lisa Booker is a 30 y.o. female who presents emergency department with body aches, chills, headache, and vomiting x2.  Patient states that she started sleeping in her car on Thursday night.  States she does not have enough money to get the hotel room where she has been living.  She is sleeping in her car outside of her work at [employer] station.  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  Patient's not having any difficulty breathing.  States she just feels very cold.  Works at [employer] so is exposed to [employer].  Unsure if she has been exposed to COVID.   Past Medical History:  Diagnosis Date   Migraine    Ovarian cyst     There are no problems to display for this patient.   Past Surgical History:  Procedure Laterality Date   TUBAL LIGATION      Prior to Admission medications   Medication Sig Start Date End Date Taking? Authorizing Provider  nirmatrelvir/ritonavir EUA (PAXLOVID) 20 x 150 MG & 10 x 100MG  TABS Take 3 tablets by mouth 2 (two) times daily for 5 days. Patient GFR is >60. Take nirmatrelvir (150 mg) two tablets twice daily for 5 days and ritonavir (100 mg) one tablet twice daily for 5 days. 07/26/21 07/31/21 Yes Jalin Alicea, 08/02/21, PA-C  promethazine (PHENERGAN) 25 MG suppository Place 1 suppository (25 mg total) rectally every 6 (six) hours as needed for nausea or vomiting. 07/26/21  Yes Samul Mcinroy, 07/28/21, PA-C  dibucaine (NUPERCAINAL) 1 % ointment Apply topically 3 (three) times daily as needed for pain. 07/19/15   09/18/15, MD  ibuprofen (ADVIL,MOTRIN) 600 MG tablet Take 1 tablet (600 mg total) by mouth every 8 (eight) hours as needed.  12/26/17   12/28/17, PA-C    Allergies Zofran Joni Reining hcl]  No family history on file.  Social History Social History   Tobacco Use   Smoking status: Every Day    Packs/day: 0.50    Types: Cigarettes   Smokeless tobacco: Never  Substance Use Topics   Alcohol use: Yes   Drug use: Yes    Types: Marijuana    Review of Systems  Constitutional: Positive fever/chills Eyes: No visual changes. ENT: No sore throat. Respiratory: Denies cough Cardiovascular: Denies chest pain Gastrointestinal: Denies abdominal pain, positive vomiting x2 Genitourinary: Negative for dysuria. Musculoskeletal: Negative for back pain. Skin: Negative for rash. Psychiatric: no mood changes,     ____________________________________________   PHYSICAL EXAM:  VITAL SIGNS: ED Triage Vitals  Enc Vitals Group     BP 07/26/21 0724 100/69     Pulse Rate 07/26/21 0724 83     Resp 07/26/21 0724 18     Temp 07/26/21 0724 98.4 F (36.9 C)     Temp Source 07/26/21 0724 Oral     SpO2 07/26/21 0724 98 %     Weight 07/26/21 0723 131 lb (59.4 kg)     Height 07/26/21 0723 5\' 3"  (1.6 m)     Head Circumference --      Peak Flow --      Pain Score 07/26/21 0723 7     Pain  Loc --      Pain Edu? --      Excl. in GC? --     Constitutional: Alert and oriented. Well appearing and in no acute distress. Eyes: Conjunctivae are normal.  Head: Atraumatic.  Face is not flushed Nose: No congestion/rhinnorhea. Mouth/Throat: Mucous membranes are moist.   Neck:  supple no lymphadenopathy noted Cardiovascular: Normal rate, regular rhythm. Heart sounds are normal Respiratory: Normal respiratory effort.  No retractions, lungs c t a  Abd: soft nontender bs normal all 4 quad GU: deferred Musculoskeletal: FROM all extremities, warm and well perfused Neurologic:  Normal speech and language.  Skin:  Skin is warm, dry and intact. No rash noted. Psychiatric: Mood and affect are normal. Speech and behavior are  normal.  ____________________________________________   LABS (all labs ordered are listed, but only abnormal results are displayed)  Labs Reviewed  RESP PANEL BY RT-PCR (FLU A&B, COVID) ARPGX2 - Abnormal; Notable for the following components:      Result Value   SARS Coronavirus 2 by RT PCR POSITIVE (*)    All other components within normal limits   ____________________________________________   ____________________________________________  RADIOLOGY    ____________________________________________   PROCEDURES  Procedure(s) performed: No  Procedures    ____________________________________________   INITIAL IMPRESSION / ASSESSMENT AND PLAN / ED COURSE  Pertinent labs & imaging results that were available during my care of the patient were reviewed by me and considered in my medical decision making (see chart for details).   The patient is a 30 year old female who is homeless and sleeps in her car presents with headache, vomiting, fever/chills.  See HPI.  Physical exam shows patient stable at this time.  Low concerns for carbon monoxide poisoning as her face is not flushed.  She has only had 2 episodes of vomiting but no vomiting here in the ED.  No difficulty breathing and no altered mental status.  Exam is more consistent with COVID.  We will do a respiratory panel, patient was given something for nausea and given Booker juice  Respiratory panel is positive for COVID.  I did explain all the findings to the patient.  She was given a prescription for Phenergan and Paxlovid.  She is to follow-up with her regular doctor if not improving in 3 days.  Return emergency department worsening.  Alternate Tylenol and ibuprofen.  Work note was given.  We did discuss her parking further away from trucks in case carbon monoxide is aggravating headaches.  Also to call the [facility] social services to see if they can help her find reduced cost housing.  She states she understands.   She is discharged stable condition.  Lisa Booker was evaluated in Emergency Department on 07/26/2021 for the symptoms described in the history of present illness. She was evaluated in the context of the global COVID-19 pandemic, which necessitated consideration that the patient might be at risk for infection with the SARS-CoV-2 virus that causes COVID-19. Institutional protocols and algorithms that pertain to the evaluation of patients at risk for COVID-19 are in a state of rapid change based on information released by regulatory bodies including the CDC and federal and state organizations. These policies and algorithms were followed during the patient's care in the ED.    As part of my medical decision making, I reviewed the following data within the electronic MEDICAL RECORD NUMBER Nursing notes reviewed and incorporated, Labs reviewed , Old chart reviewed, Notes from prior ED visits, and [place] Controlled  Substance Database  ____________________________________________   FINAL CLINICAL IMPRESSION(S) / ED DIAGNOSES  Final diagnoses:  Suspected COVID-19 virus infection  COVID-19      NEW MEDICATIONS STARTED DURING THIS VISIT:  Discharge Medication List as of 07/26/2021  9:12 AM     START taking these medications   Details  promethazine (PHENERGAN) 25 MG suppository Place 1 suppository (25 mg total) rectally every 6 (six) hours as needed for nausea or vomiting., Starting Mon 07/26/2021, Normal         Note:  This document was prepared using Dragon voice recognition software and may include unintentional dictation errors.    Faythe Ghee, PA-C 07/26/21 0928    Faythe Ghee, PA-C 07/26/21 1916    Jene Every, MD 07/26/21 1432

## 2021-07-26 NOTE — ED Triage Notes (Signed)
Pt to ED for covid sx that started last night. Reports body aches, chills, nausea

## 2021-07-26 NOTE — ED Notes (Signed)
See triage note  presents with body aches and chills  states sx's started last pm  vomiting times 2 last pm  afebrile on arrival

## 2022-08-01 ENCOUNTER — Emergency Department: Payer: Self-pay

## 2022-08-01 ENCOUNTER — Encounter: Payer: Self-pay | Admitting: Emergency Medicine

## 2022-08-01 ENCOUNTER — Emergency Department
Admission: EM | Admit: 2022-08-01 | Discharge: 2022-08-01 | Disposition: A | Discharge status: Home / Self Care | Payer: Self-pay | Attending: Emergency Medicine | Admitting: Emergency Medicine

## 2022-08-01 ENCOUNTER — Other Ambulatory Visit: Payer: Self-pay

## 2022-08-01 DIAGNOSIS — M25512 Pain in left shoulder: Secondary | ICD-10-CM | POA: Insufficient documentation

## 2022-08-01 MED ORDER — NAPROXEN 500 MG PO TABS
250.0000 mg | ORAL_TABLET | Freq: Once | ORAL | Status: AC
  Administered 2022-08-01: 250 mg via ORAL
  Filled 2022-08-01: qty 1

## 2022-08-01 MED ORDER — NAPROXEN 250 MG PO TABS: 250.0000 mg | ORAL_TABLET | Freq: Two times a day (BID) | ORAL | 0 refills | Status: AC

## 2022-08-01 MED ORDER — OXYCODONE-ACETAMINOPHEN 5-325 MG PO TABS
1.0000 | ORAL_TABLET | Freq: Once | ORAL | Status: AC
  Administered 2022-08-01: 1 via ORAL
  Filled 2022-08-01: qty 1

## 2022-08-01 NOTE — Discharge Instructions (Addendum)
You can take the naproxen twice a day for your shoulder pain.  Please continue doing mobilization exercises.  Please follow-up with primary care if your shoulder pain persist as you may need to have additional imaging such as an MRI to evaluate your rotator cuff.

## 2022-08-01 NOTE — ED Notes (Signed)
See triage note  Presents with pain to left shoulder  States pain started w/o injury  States pain is now from neck into left shoulder

## 2022-08-01 NOTE — ED Triage Notes (Signed)
Pt arrived via POV with reports of L shoulder pain, pt states pain began Saturday pt reports it is painful to lift her arms up and has decreased ROM.  Pt denies any injury, states when she tries to move it the pain shoots down arm and  up to  her neck.

## 2022-08-01 NOTE — ED Provider Notes (Signed)
[facility] Provider Note    Event Date/Time   First MD Initiated Contact with Patient 08/01/22 0700     (approximate)   History   Shoulder Pain   HPI  Lisa Booker is a 31 y.o. female past medical history of migraines who presents with shoulder pain.  Symptoms started on Saturday 2 days ago.  She was doing housework but denies any other trauma.  Pain is located in the left shoulder rating up to the left neck.  Pain increases when she tries to lift the arm above 90 degrees.  Denies any numbness or tingling.  Does have history of bilateral shoulder pain but this feels more severe.  Has not taking ibuprofen for it and icing and trying stretches.     Past Medical History:  Diagnosis Date   Migraine    Ovarian cyst     There are no problems to display for this patient.    Physical Exam  Triage Vital Signs: ED Triage Vitals  Enc Vitals Group     BP 08/01/22 0647 121/63     Pulse Rate 08/01/22 0647 76     Resp 08/01/22 0647 18     Temp 08/01/22 0647 98.2 F (36.8 C)     Temp src --      SpO2 08/01/22 0647 96 %     Weight 08/01/22 0648 132 lb (59.9 kg)     Height 08/01/22 0648 5\' 3"  (1.6 m)     Head Circumference --      Peak Flow --      Pain Score 08/01/22 0653 5     Pain Loc --      Pain Edu? --      Excl. in [place]? --     Most recent vital signs: Vitals:   08/01/22 0647  BP: 121/63  Pulse: 76  Resp: 18  Temp: 98.2 F (36.8 C)  SpO2: 96%     General: Awake, no distress.  CV:  Good peripheral perfusion.  Resp:  Normal effort.  Abd:  No distention.  Neuro:             Awake, Alert, Oriented x 3  Other:  Tenderness to palpation over the left anterior shoulder and left trap without deformity or skin changes Patient able to flex and abduct to 90 but with significant discomfort Positive impingement testing 2+ radial pulse Intact grip strength elbow flexion and extension   ED Results / Procedures / Treatments  Labs (all labs  ordered are listed, but only abnormal results are displayed) Labs Reviewed - No data to display   EKG     RADIOLOGY Shoulder x-ray reviewed and interpreted myself is negative for fracture   PROCEDURES:  Critical Care performed: No  Procedures    MEDICATIONS ORDERED IN ED: Medications  oxyCODONE-acetaminophen (PERCOCET/ROXICET) 5-325 MG per tablet 1 tablet (1 tablet Oral Given 08/01/22 0716)  naproxen (NAPROSYN) tablet 250 mg (250 mg Oral Given 08/01/22 0716)     IMPRESSION / MDM / [place] / ED COURSE  I reviewed the triage vital signs and the nursing notes.                              Patient's presentation is most consistent with acute, uncomplicated illness.  Differential diagnosis includes, but is not limited to, rotator cuff tendinitis, shoulder bursitis, glenohumeral arthritis, cervical strain  Patient is a 31 year old female  presents with atraumatic left shoulder pain for the last 2 days.  Pain is in the left shoulder rating it up to the left neck.  She has pain primarily with range over 90 degrees.  Has had history of bilateral chronic shoulder pain in the past but this is worse.  On exam she overall appears well.  There is no overlying skin changes or obvious deformity of the shoulder.  She is tender along the trap and anterior shoulder and she has pain with flexion and abduction but range of motion is preserved.  Does have positive impingement testing.  Differential is as above suspect rotator cuff tendinitis versus osteoarthritis versus bursitis.  Discussed supportive care with NSAIDs and range of motion exercises.  Will start on naproxen.  Advised that if pain is not improving she follow-up with primary care may need to have further assessment with advanced imaging if not improving.         FINAL CLINICAL IMPRESSION(S) / ED DIAGNOSES   Final diagnoses:  Pain in joint of left shoulder     Rx / DC Orders   ED Discharge Orders          Ordered     naproxen (NAPROSYN) 250 MG tablet  2 times daily with meals        08/01/22 0743             Note:  This document was prepared using Dragon voice recognition software and may include unintentional dictation errors.   Rada Hay, MD 08/01/22 [phone]

## 2022-08-04 ENCOUNTER — Other Ambulatory Visit: Payer: Self-pay

## 2022-08-04 ENCOUNTER — Emergency Department
Admission: EM | Admit: 2022-08-04 | Discharge: 2022-08-04 | Disposition: A | Discharge status: Home / Self Care | Payer: Medicaid Other | Attending: Emergency Medicine | Admitting: Emergency Medicine

## 2022-08-04 ENCOUNTER — Encounter: Payer: Self-pay | Admitting: Emergency Medicine

## 2022-08-04 DIAGNOSIS — M546 Pain in thoracic spine: Secondary | ICD-10-CM | POA: Insufficient documentation

## 2022-08-04 DIAGNOSIS — M25512 Pain in left shoulder: Secondary | ICD-10-CM | POA: Insufficient documentation

## 2022-08-04 DIAGNOSIS — M62838 Other muscle spasm: Secondary | ICD-10-CM

## 2022-08-04 MED ORDER — BACLOFEN 10 MG PO TABS: 10.0000 mg | ORAL_TABLET | Freq: Three times a day (TID) | ORAL | 0 refills | Status: AC

## 2022-08-04 MED ORDER — PREDNISONE 10 MG (21) PO TBPK: ORAL_TABLET | ORAL | 0 refills | Status: AC

## 2022-08-04 NOTE — ED Triage Notes (Signed)
Patient to ED for bilateral shoulder pain since Saturday. Seen for same on 9/25 but states pain has gotten worse.

## 2022-08-04 NOTE — Discharge Instructions (Signed)
Follow-up with your regular doctor.  Use wet heat to help with the muscle spasms.  Take medications as prescribed.  Stop the naproxen.  May also take Tylenol.

## 2022-08-04 NOTE — ED Provider Notes (Signed)
[facility] Provider Note    Event Date/Time   First MD Initiated Contact with Patient 08/04/22 1136     (approximate)   History   Shoulder Pain   HPI  Lisa Booker is a 31 y.o. female presents emergency department complaining of shoulder and upper back pain.  Patient states it hurts to move.  She has been holding her arms tied into her body causing her to lose balance.  Works on [employer].  No numbness or tingling.  No chest pain shortness of breath.  Patient was seen here x-rays were done and were negative.  Patient was told she had tendinitis and was given naproxen      Physical Exam   Triage Vital Signs: ED Triage Vitals  Enc Vitals Group     BP 08/04/22 1135 114/76     Pulse Rate 08/04/22 1135 74     Resp 08/04/22 1135 18     Temp 08/04/22 1135 99 F (37.2 C)     Temp Source 08/04/22 1135 Oral     SpO2 08/04/22 1135 98 %     Weight 08/04/22 1128 135 lb (61.2 kg)     Height 08/04/22 1128 5\' 3"  (1.6 m)     Head Circumference --      Peak Flow --      Pain Score 08/04/22 1128 7     Pain Loc --      Pain Edu? --      Excl. in [place]? --     Most recent vital signs: Vitals:   08/04/22 1135  BP: 114/76  Pulse: 74  Resp: 18  Temp: 99 F (37.2 C)  SpO2: 98%     General: Awake, no distress.   CV:  Good peripheral perfusion. regular rate and  rhythm Resp:  Normal effort. Lungs CTA Abd:  No distention.   Other:  Spasms noted in the upper back   ED Results / Procedures / Treatments   Labs (all labs ordered are listed, but only abnormal results are displayed) Labs Reviewed - No data to display   EKG     RADIOLOGY     PROCEDURES:   Procedures   MEDICATIONS ORDERED IN ED: Medications - No data to display   IMPRESSION / MDM / [place] / ED COURSE  I reviewed the triage vital signs and the nursing notes.                              Differential diagnosis includes, but is  not limited to, muscle spasm, cervical strain, bursitis  Patient's presentation is most consistent with acute, uncomplicated illness.   In review of the old charts patient's x-ray of the left shoulder was negative.  Will change her medication from naproxen to prednisone and baclofen.  She is to apply wet heat to loosen the muscles.  Is given a work note for today.  Discharged stable condition.  Instructed follow-up with orthopedics.      FINAL CLINICAL IMPRESSION(S) / ED DIAGNOSES   Final diagnoses:  Muscle spasm     Rx / DC Orders   ED Discharge Orders          Ordered    predniSONE (STERAPRED UNI-PAK 21 TAB) 10 MG (21) TBPK tablet        08/04/22 1137    baclofen (LIORESAL) 10 MG tablet  3 times daily  08/04/22 1137             Note:  This document was prepared using Dragon voice recognition software and may include unintentional dictation errors.    Versie Starks, PA-C 08/04/22 1139    Carrie Mew, MD 08/04/22 2002

## 2022-09-05 ENCOUNTER — Emergency Department: Payer: Self-pay

## 2022-09-05 ENCOUNTER — Encounter: Payer: Self-pay | Admitting: Emergency Medicine

## 2022-09-05 ENCOUNTER — Other Ambulatory Visit: Payer: Self-pay

## 2022-09-05 ENCOUNTER — Emergency Department
Admission: EM | Admit: 2022-09-05 | Discharge: 2022-09-05 | Disposition: A | Discharge status: Home / Self Care | Payer: Self-pay | Attending: Emergency Medicine | Admitting: Emergency Medicine

## 2022-09-05 DIAGNOSIS — Z1152 Encounter for screening for COVID-19: Secondary | ICD-10-CM | POA: Insufficient documentation

## 2022-09-05 DIAGNOSIS — J22 Unspecified acute lower respiratory infection: Secondary | ICD-10-CM | POA: Insufficient documentation

## 2022-09-05 LAB — SARS CORONAVIRUS 2 BY RT PCR: SARS Coronavirus 2 by RT PCR: NEGATIVE

## 2022-09-05 MED ORDER — BENZONATATE 100 MG PO CAPS: 100.0000 mg | ORAL_CAPSULE | Freq: Three times a day (TID) | ORAL | 0 refills | Status: AC | PRN

## 2022-09-05 MED ORDER — DOXYCYCLINE MONOHYDRATE 100 MG PO TABS: 100.0000 mg | ORAL_TABLET | Freq: Two times a day (BID) | ORAL | 0 refills | Status: AC

## 2022-09-05 MED ORDER — ALBUTEROL SULFATE HFA 108 (90 BASE) MCG/ACT IN AERS: 2.0000 | INHALATION_SPRAY | Freq: Four times a day (QID) | RESPIRATORY_TRACT | 2 refills | Status: AC | PRN

## 2022-09-05 MED ORDER — AMOXICILLIN-POT CLAVULANATE 875-125 MG PO TABS: 1.0000 | ORAL_TABLET | Freq: Two times a day (BID) | ORAL | 0 refills | Status: AC

## 2022-09-05 NOTE — Discharge Instructions (Addendum)
-  Take the full course of the antibiotics (amoxicillin and doxycycline) as prescribed.  -You may take the benzonatate as needed for cough.  You may additionally take the albuterol as needed for cough and chest tightness.  -Follow-up with your primary care provider within the next week as needed.  -Return to the emergency department anytime if you begin to experience any new or worsening symptoms.

## 2022-09-05 NOTE — ED Provider Notes (Signed)
[facility] Provider Note    Event Date/Time   First MD Initiated Contact with Patient 09/05/22 [phone]     (approximate)   History   Chief Complaint Cough and Generalized Body Aches   HPI Lisa Booker is a 31 y.o. female, history of migraines, ovarian cyst, presents emergency department for evaluation of flulike symptoms.  She endorses cough, congestion, body aches, and fever-like sensation x2 weeks.  She states other family members have been sick with similar symptoms as well.  She took a COVID test at home, which was reportedly negative.  She states that her symptoms did improve after about a week, however has not had an acute worsening.  Denies abdominal pain, chest pain, flank pain, nausea/vomiting, diarrhea, dysuria, headache, dizziness/lightheadedness, or rash/lesions.   History Limitations: No limitations.        Physical Exam  Triage Vital Signs: ED Triage Vitals  Enc Vitals Group     BP 09/05/22 0743 106/68     Pulse Rate 09/05/22 0743 72     Resp 09/05/22 0743 17     Temp 09/05/22 0743 98.1 F (36.7 C)     Temp Source 09/05/22 0743 Oral     SpO2 09/05/22 0743 97 %     Weight 09/05/22 0742 130 lb (59 kg)     Height 09/05/22 0742 5\' 3"  (1.6 m)     Head Circumference --      Peak Flow --      Pain Score 09/05/22 0741 6     Pain Loc --      Pain Edu? --      Excl. in [place]? --     Most recent vital signs: Vitals:   09/05/22 0743 09/05/22 0938  BP: 106/68 110/70  Pulse: 72 70  Resp: 17 16  Temp: 98.1 F (36.7 C)   SpO2: 97% 98%    General: Awake, NAD.  Skin: Warm, dry. No rashes or lesions.  Eyes: PERRL. Conjunctivae normal.  CV: Good peripheral perfusion.  Resp: Normal effort.  Lung sounds reveal moderate rhonchi bilaterally, particularly on the lower left side. Abd: Soft, non-tender. No distention.  Neuro: At baseline. No gross neurological deficits.  Musculoskeletal: Normal ROM of all extremities.  Physical Exam    ED  Results / Procedures / Treatments  Labs (all labs ordered are listed, but only abnormal results are displayed) Labs Reviewed  SARS CORONAVIRUS 2 BY RT PCR     EKG N/A.    RADIOLOGY  ED Provider Interpretation: I personally viewed and interpreted this x-ray, ill-defined airspace opacity in the lower left lobe, concerning for pneumonia.  DG Chest 2 View  Result Date: 09/05/2022 CLINICAL DATA:  Cough, fever, and body aches for 2 weeks. EXAM: CHEST - 2 VIEW COMPARISON:  02/17/2021 FINDINGS: The heart size and mediastinal contours are within normal limits. New mild ill-defined airspace opacity is seen in the lateral left lower lobe on the frontal projection, suspicious for pneumonia. Right lung is clear. No evidence of pleural effusion. IMPRESSION: New mild ill-defined airspace opacity in lateral left lower lobe, suspicious for pneumonia. Electronically Signed   By: 02/19/2021 M.D.   On: 09/05/2022 09:19    PROCEDURES:  Critical Care performed: N/A.  Procedures    MEDICATIONS ORDERED IN ED: Medications - No data to display   IMPRESSION / MDM / ASSESSMENT AND PLAN / ED COURSE  I reviewed the triage vital signs and the nursing notes.  Differential diagnosis includes, but is not limited to, COVID-19, influenza, commune acquired pneumonia, allergic rhinitis, viral URI,   Assessment/Plan Patient presents with cough, congestion, body aches, and fever x2 weeks.  Reportedly had improvement after 1 week, followed by acute worsening.  Chest x-ray shows evidence of community-acquired pneumonia, particular on the left side.  Viral etiology at first with acute worsening developing into pneumonia.  We will provide her with prescriptions for Augmentin and doxycycline.  Additionally provided with albuterol been sent to to help manage her symptoms.  Recommend that she follow-up with her doctor in the next few days to ensure some improvement.  She is agreeable to  this plan.  With discharge.  Considered admission for this patient, but given her stable presentation and access to follow-up, she is unlikely benefit from admission.  Provided the patient with anticipatory guidance, return precautions, and educational material. Encouraged the patient to return to the emergency department at any time if they begin to experience any new or worsening symptoms. Patient expressed understanding and agreed with the plan.   Patient's presentation is most consistent with acute complicated illness / injury requiring diagnostic workup.       FINAL CLINICAL IMPRESSION(S) / ED DIAGNOSES   Final diagnoses:  Lower respiratory infection (e.g., bronchitis, pneumonia, pneumonitis, pulmonitis)     Rx / DC Orders   ED Discharge Orders          Ordered    amoxicillin-clavulanate (AUGMENTIN) 875-125 MG tablet  2 times daily        09/05/22 0931    doxycycline (ADOXA) 100 MG tablet  2 times daily        09/05/22 0931    albuterol (VENTOLIN HFA) 108 (90 Base) MCG/ACT inhaler  Every 6 hours PRN        09/05/22 0931    benzonatate (TESSALON PERLES) 100 MG capsule  3 times daily PRN        09/05/22 0931             Note:  This document was prepared using Dragon voice recognition software and may include unintentional dictation errors.   Teodoro Spray, [place] 09/05/22 1555    Harvest Dark, MD 09/06/22 1422

## 2022-09-05 NOTE — ED Triage Notes (Signed)
Pt c/o cough, congestion, body aches x 2 weeks. Reports "fever" but unable to check it. At home covid test negative per pt. No acute distress during triage.

## 2022-09-23 ENCOUNTER — Emergency Department: Payer: Medicaid Other

## 2022-09-23 ENCOUNTER — Emergency Department
Admission: EM | Admit: 2022-09-23 | Discharge: 2022-09-23 | Disposition: A | Discharge status: Home / Self Care | Payer: Self-pay | Attending: Emergency Medicine | Admitting: Emergency Medicine

## 2022-09-23 ENCOUNTER — Encounter: Payer: Self-pay | Admitting: Emergency Medicine

## 2022-09-23 ENCOUNTER — Other Ambulatory Visit: Payer: Self-pay

## 2022-09-23 DIAGNOSIS — R059 Cough, unspecified: Secondary | ICD-10-CM | POA: Insufficient documentation

## 2022-09-23 DIAGNOSIS — R0600 Dyspnea, unspecified: Secondary | ICD-10-CM

## 2022-09-23 DIAGNOSIS — Z20822 Contact with and (suspected) exposure to covid-19: Secondary | ICD-10-CM | POA: Insufficient documentation

## 2022-09-23 DIAGNOSIS — R0789 Other chest pain: Secondary | ICD-10-CM

## 2022-09-23 LAB — RESP PANEL BY RT-PCR (FLU A&B, COVID) ARPGX2
Influenza A by PCR: NEGATIVE
Influenza B by PCR: NEGATIVE
SARS Coronavirus 2 by RT PCR: NEGATIVE

## 2022-09-23 LAB — CBC
HCT: 40.6 % (ref 36.0–46.0)
Hemoglobin: 13.7 g/dL (ref 12.0–15.0)
MCH: 31.1 pg (ref 26.0–34.0)
MCHC: 33.7 g/dL (ref 30.0–36.0)
MCV: 92.1 fL (ref 80.0–100.0)
Platelets: 253 10*3/uL (ref 150–400)
RBC: 4.41 MIL/uL (ref 3.87–5.11)
RDW: 13.7 % (ref 11.5–15.5)
WBC: 10.8 10*3/uL — ABNORMAL HIGH (ref 4.0–10.5)
nRBC: 0 % (ref 0.0–0.2)

## 2022-09-23 LAB — BASIC METABOLIC PANEL
Anion gap: 8 (ref 5–15)
BUN: 10 mg/dL (ref 6–20)
CO2: 22 mmol/L (ref 22–32)
Calcium: 9.1 mg/dL (ref 8.9–10.3)
Chloride: 107 mmol/L (ref 98–111)
Creatinine, Ser: 0.84 mg/dL (ref 0.44–1.00)
GFR, Estimated: >60 mL/min (ref >60)
Glucose, Bld: 88 mg/dL (ref 70–99)
Potassium: 3.5 mmol/L (ref 3.5–5.1)
Sodium: 137 mmol/L (ref 135–145)

## 2022-09-23 LAB — D-DIMER, QUANTITATIVE: D-Dimer, Quant: <0.27 ug/mL-FEU (ref 0.00–0.50)

## 2022-09-23 MED ORDER — KETOROLAC TROMETHAMINE 15 MG/ML IJ SOLN
15.0000 mg | Freq: Once | INTRAMUSCULAR | Status: AC
  Administered 2022-09-23: 15 mg via INTRAVENOUS
  Filled 2022-09-23: qty 1

## 2022-09-23 MED ORDER — KETOROLAC TROMETHAMINE 10 MG PO TABS: 10.0000 mg | ORAL_TABLET | Freq: Four times a day (QID) | ORAL | 0 refills | Status: AC | PRN

## 2022-09-23 NOTE — ED Provider Notes (Signed)
[facility] Provider Note    Event Date/Time   First MD Initiated Contact with Patient 09/23/22 1056     (approximate)   History   Shortness of Breath   HPI  Lisa Booker is a 31 y.o. female who was seen on the 30th of last month for pneumonia.  She reports she is taking all of her antibiotics got better but now she is getting worse again she is coughing she has pain in her chest with deep breathing or coughing.  Her ribs hurt.  She is not running a fever.  Cough is nonproductive.      Physical Exam   Triage Vital Signs: ED Triage Vitals  Enc Vitals Group     BP 09/23/22 1021 113/73     Pulse Rate 09/23/22 1021 77     Resp 09/23/22 1021 17     Temp 09/23/22 1021 98.2 F (36.8 C)     Temp Source 09/23/22 1021 Oral     SpO2 09/23/22 1021 98 %     Weight 09/23/22 1023 130 lb (59 kg)     Height 09/23/22 1023 5\' 3"  (1.6 m)     Head Circumference --      Peak Flow --      Pain Score 09/23/22 1022 6     Pain Loc --      Pain Edu? --      Excl. in [place]? --     Most recent vital signs: Vitals:   09/23/22 1021 09/23/22 1300  BP: 113/73 (!) 104/56  Pulse: 77 70  Resp: 17 16  Temp: 98.2 F (36.8 C)   SpO2: 98% 99%    General: Awake, complaining of pain in her lower ribs CV:  Good peripheral perfusion.  Heart regular rate and rhythm no audible murmurs Resp:  Normal effort.  Lungs are clear Chest: Lower ribs are tender to palpation Abd:  No distention.  Soft and nontender Extremities: No edema   ED Results / Procedures / Treatments   Labs (all labs ordered are listed, but only abnormal results are displayed) Labs Reviewed  CBC - Abnormal; Notable for the following components:      Result Value   WBC 10.8 (*)    All other components within normal limits  RESP PANEL BY RT-PCR (FLU A&B, COVID) ARPGX2  BASIC METABOLIC PANEL  D-DIMER, QUANTITATIVE     EKG  EKG read and interpreted by me shows Normal sinus rhythm rate of 71  computer is reading a rightward axis.  Is right on the hairy edge at about 90 degrees.  No other changes are seen.   RADIOLOGY Chest x-ray read by radiology reviewed and interpreted by me shows improving left lower lobe density.   PROCEDURES:  Critical Care performed:   Procedures   MEDICATIONS ORDERED IN ED: Medications  ketorolac (TORADOL) 15 MG/ML injection 15 mg (15 mg Intravenous Given 09/23/22 1250)     IMPRESSION / MDM / ASSESSMENT AND PLAN / ED COURSE  I reviewed the triage vital signs and the nursing notes. ----------------------------------------- 12:56 PM on 09/23/2022 ----------------------------------------- X-ray is improving blood work looks okay D-dimer is negative.  It may be that the patient's pain is causing her to not be out of breathe well.  We will try and see if getting the pain to be better improves her breathing has so we will let her go.  Differential diagnosis includes, but is not limited to, pneumonia, pulmonary embolus, rib fracture,  rib strain  Patient's presentation is most consistent with acute complicated illness / injury requiring diagnostic workup. The patient is on the cardiac monitor to evaluate for evidence of arrhythmia and/or significant heart rate changes.  None were seen   I did consider admitting the patient however she was not hypoxic did not have a fever and got considerably better after Toradol IV.  I will try some Toradol p.o. for few days and see if this keeps her better.  She will return if she has any further problems.   FINAL CLINICAL IMPRESSION(S) / ED DIAGNOSES   Final diagnoses:  Dyspnea, unspecified type  Chest wall pain     Rx / DC Orders   ED Discharge Orders          Ordered    ketorolac (TORADOL) 10 MG tablet  Every 6 hours PRN        09/23/22 1304             Note:  This document was prepared using Dragon voice recognition software and may include unintentional dictation errors.   Arnaldo Natal,  MD 09/23/22 [phone]

## 2022-09-23 NOTE — ED Triage Notes (Signed)
Pt in via POV, reports being seen here for PNA a couple of weeks ago, states, "I am not feeling any better."  Complaints of ongoing shortness of breath, and bilateral rib pain.  Ambulatory to triage, NAD noted at this time.  Vitals WDL.

## 2022-09-23 NOTE — Discharge Instructions (Addendum)
Take the Toradol 1 pill 4 times a day as needed for pain.  Take it with food because it may upset your stomach.  Please return if you have any fever or increasing shortness of breath or pain.  If your pain gets better after couple days just take the Toradol for 1 more day after the pain gets better and you can stop it.

## 2022-09-23 NOTE — ED Notes (Signed)
D/C and new RX of Toradol discussed with pt, pt also educated to not take additional NSAIDS with this medication. Pt ambulatory with steady gait. NAD noted on D/C.

## 2024-05-24 ENCOUNTER — Encounter: Payer: Self-pay | Admitting: Advanced Practice Midwife
